# Patient Record
Sex: Male | Born: 1985 | Race: White | Hispanic: No | Marital: Married | Smoking: Current some day smoker
Health system: Southern US, Community
[De-identification: ages and names within clinical notes are randomized; demographics above are authoritative.]

## PROBLEM LIST (undated history)

## (undated) DIAGNOSIS — K219 Gastro-esophageal reflux disease without esophagitis: Secondary | ICD-10-CM

## (undated) DIAGNOSIS — I10 Essential (primary) hypertension: Secondary | ICD-10-CM

## (undated) DIAGNOSIS — I4891 Unspecified atrial fibrillation: Secondary | ICD-10-CM

## (undated) HISTORY — DX: Gastro-esophageal reflux disease without esophagitis: K21.9

---
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
## 2005-02-11 ENCOUNTER — Ambulatory Visit (HOSPITAL_COMMUNITY): Admission: RE | Admit: 2005-02-11 | Discharge: 2005-02-11 | Payer: Self-pay | Admitting: Family Medicine

## 2006-09-15 ENCOUNTER — Emergency Department (HOSPITAL_COMMUNITY): Admission: EM | Admit: 2006-09-15 | Discharge: 2006-09-15 | Payer: Self-pay | Admitting: Emergency Medicine

## 2009-01-02 ENCOUNTER — Emergency Department: Payer: Self-pay | Admitting: Internal Medicine

## 2010-07-09 NOTE — Consult Note (Signed)
Patrick Chase, Patrick Chase                 ACCOUNT NO.:  [id number]   MEDICAL RECORD NO.:  [id number]          PATIENT TYPE:  AMB   LOCATION:                                 FACILITY:   PHYSICIAN:  Lionel December, M.D.    DATE OF BIRTH:  1985-03-21   DATE OF CONSULTATION:  DATE OF DISCHARGE:                                   CONSULTATION   L   PRESENTING COMPLAINT:  Rectal bleeding.   HISTORY OF PRESENT ILLNESS:  Patrick Chase is an 25 year old Caucasian male who was  referred through the courtesy of Dr. Phillips Odor for evaluation of rectal  bleeding which started about 3 months ago. All of these episodes occurred  with his bowel movements. He noted fresh blood enough to color the water  red. He denies diarrhea and/or constipation. He has occasional fleeting  lower abdominal pain. He has a fair appetite. His appetite has dropped since  he has been on Adderall. He denies nausea, vomiting, heart burn or  dysphagia. He has not lost any weight recently. He does lift a lot of  weights. At times he has lifted as much as 290 pounds. He has never had any  problem with hemorrhoids.   CURRENT MEDICATIONS:  He is on Adderall 30 mg daily, Aciphex 20 mg q.a.m.  but he has taken perhaps 2 or 3 doses in the last 6 weeks. Aleve p.r.n. He  was also given a prescription for Anusol suppositories but has not used it  yet.   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 He has never had any surgeries.   ALLERGIES:  No known drug allergies.   FAMILY HISTORY:  Both parents and two half-brothers are in good health.   SOCIAL HISTORY:  He is single. He is a Holiday representative at [employer] and plans to go  to [employer] next year. He has been smoking 4-5 cigarettes per day for the last 3  years or so. He drinks beer usually on weekends, no more than 3-4 cans each  weekend.   PHYSICAL EXAMINATION:  GENERAL:  Pleasant, well-developed, thin Caucasian  male who is in no acute  distress. Weight is 154.5 pounds and he is 6 feet  and 1 inch tall.  VITAL SIGNS: Pulse 56 per minute. Blood pressure 112/80, temperature is  97.6.  HEENT:  Conjunctivae pink, sclerae anicteric. Oropharyngeal mucosa is  normal.  NECK:  No neck masses are noted.  CARDIAC EXAM:  Regular rhythm, normal S1, S2, no murmur or gallop noted.  LUNGS:  Clear to auscultation.  ABDOMEN:  Flat, bowel sounds are normal. Palpation reveals a soft abdomen  without tenderness, organomegaly or masses.  RECTAL:  Deferred.  EXTREMITIES:  No clubbing or peripheral edema.   ASSESSMENT:  Patrick Chase is an 25 year old Caucasian male who has had a few  episodes of hematochezia when he has passed small to moderate amounts of  blood. He has had occasional fleeting lower abdominal pain but he denies  diarrhea and/or constipation. His history if pertinent for weight lifting,  he is lifting as much as 290 pounds. I suspect he may be bleeding internally  from internal hemorrhoids. However, given clinical course, need to rule out  proctitis, colitis, or neoplasm.   RECOMMENDATIONS:  He will have a CBC, PT and PTT at the time of colonoscopy.   Total colonoscopy to be performed in the near future once he has graduated  from school.  I have reviewed the procedure and risks with the patient and his mother and  they both are agreeable.   I would like to thank Dr. Phillips Odor for the opportunity to participate in the  care of this nice young man.       NR/MEDQ  D:  07/05/2004  T:  07/06/2004  Job:  [id number]   cc:   Corrie Mckusick, M.D.  Fax: [phone]

## 2010-07-09 NOTE — Op Note (Signed)
NAMEARELI, Chase                 ACCOUNT NO.:  [id number]   MEDICAL RECORD NO.:  [id number]          PATIENT TYPE:  AMB   LOCATION:  DAY                           FACILITY:  [facility]   PHYSICIAN:  Lionel December, M.D.    DATE OF BIRTH:  12/31/1985   DATE OF PROCEDURE:  08/11/2004  DATE OF DISCHARGE:                                 OPERATIVE REPORT   PROCEDURE:  Colonoscopy which endoscopy.   INDICATIONS:  Patrick is an 25 year old Caucasian male with at least three-  month history of hematochezia without change in bowel habits or diarrhea. It  is suspected he may have hemorrhoids. Since bleeding is recurrent, he is  undergoing colonoscopy to make sure he does not have proctitis or neoplasm.  Procedure risks were reviewed with the patient, and informed consent was  obtained.   PREMEDICATION:  Demerol 50 mg IV, Versed 11 mg IV in divided dose.   FINDINGS:  Procedure performed in endoscopy suite. The patient's vital signs  and O2 saturation were monitored during the procedure and remained stable.  The patient was placed in left lateral position. Rectal examination  performed. No abnormality noted on external or digital exam. The Olympus  videoscope was placed in the rectum and advanced under vision into sigmoid  colon and beyond. Preparation was satisfactory. Scope was passed into the  cecum which was identified by appendiceal orifice and ileocecal valve.  Pictures taken for the record. The TI was examined for 10-15 cm and was  normal. As the scope was withdrawn, colonic mucosa was carefully examined.  There was a single tiny erosion at sigmoid colon felt to be nonspecific and  left alone. Mucosa of the rest of the sigmoid colon was normal. There was a  focal area of erythema and edema with some granularity at the rectum seen on  retroflexed view. It was biopsied for histology. There is single small  hemorrhoids above the dentate line. Endoscope was straightened and  withdrawn. The patient  tolerated the procedure well.   FINAL DIAGNOSIS:  1.  Normal terminal ileoscopy.  2.  A single small erosion at sigmoid colon felt to be nonspecific.  3.  Focal proctitis, biopsy obtained for histology.  4.  Small internal hemorrhoids.   Suspect his hematochezia could be secondary to proctitis or hemorrhoids.   RECOMMENDATIONS:  1.  Standard instructions given. I will contact the patient with results of      biopsies, CBC, PT/PTT.  2.  Canasa suppository 1 g per rectum q.h.s. for 30 days.       NR/MEDQ  D:  08/11/2004  T:  08/11/2004  Job:  [id number]   cc:   Corrie Mckusick, M.D.  Fax: [phone]

## 2013-04-04 ENCOUNTER — Ambulatory Visit: Payer: Self-pay | Admitting: Family Medicine

## 2014-05-01 ENCOUNTER — Emergency Department (HOSPITAL_COMMUNITY)
Admission: EM | Admit: 2014-05-01 | Discharge: 2014-05-01 | Disposition: A | Discharge status: Home / Self Care | Payer: BLUE CROSS/BLUE SHIELD | Attending: Emergency Medicine | Admitting: Emergency Medicine

## 2014-05-01 ENCOUNTER — Encounter (HOSPITAL_COMMUNITY): Payer: Self-pay | Admitting: Emergency Medicine

## 2014-05-01 ENCOUNTER — Emergency Department (HOSPITAL_COMMUNITY): Payer: BLUE CROSS/BLUE SHIELD

## 2014-05-01 DIAGNOSIS — I480 Paroxysmal atrial fibrillation: Secondary | ICD-10-CM | POA: Insufficient documentation

## 2014-05-01 DIAGNOSIS — R002 Palpitations: Secondary | ICD-10-CM | POA: Diagnosis present

## 2014-05-01 DIAGNOSIS — Z87891 Personal history of nicotine dependence: Secondary | ICD-10-CM | POA: Insufficient documentation

## 2014-05-01 DIAGNOSIS — I10 Essential (primary) hypertension: Secondary | ICD-10-CM | POA: Insufficient documentation

## 2014-05-01 HISTORY — DX: Essential (primary) hypertension: I10

## 2014-05-01 LAB — CBC WITH DIFFERENTIAL/PLATELET
BASOS PCT: 0 % (ref 0–1)
Basophils Absolute: 0 10*3/uL (ref 0.0–0.1)
Eosinophils Absolute: 0.1 10*3/uL (ref 0.0–0.7)
Eosinophils Relative: 2 % (ref 0–5)
HCT: 43.9 % (ref 39.0–52.0)
Hemoglobin: 15.6 g/dL (ref 13.0–17.0)
LYMPHS PCT: 26 % (ref 12–46)
Lymphs Abs: 1.9 10*3/uL (ref 0.7–4.0)
MCH: 32.8 pg (ref 26.0–34.0)
MCHC: 35.5 g/dL (ref 30.0–36.0)
MCV: 92.4 fL (ref 78.0–100.0)
MONOS PCT: 14 % — AB (ref 3–12)
Monocytes Absolute: 1.1 10*3/uL — ABNORMAL HIGH (ref 0.1–1.0)
NEUTROS PCT: 58 % (ref 43–77)
Neutro Abs: 4.3 10*3/uL (ref 1.7–7.7)
PLATELETS: 284 10*3/uL (ref 150–400)
RBC: 4.75 MIL/uL (ref 4.22–5.81)
RDW: 11.9 % (ref 11.5–15.5)
WBC: 7.4 10*3/uL (ref 4.0–10.5)

## 2014-05-01 LAB — COMPREHENSIVE METABOLIC PANEL
ALT: 28 U/L (ref 0–53)
AST: 17 U/L (ref 0–37)
Albumin: 4.4 g/dL (ref 3.5–5.2)
Alkaline Phosphatase: 67 U/L (ref 39–117)
Anion gap: 8 (ref 5–15)
BILIRUBIN TOTAL: 1 mg/dL (ref 0.3–1.2)
BUN: 16 mg/dL (ref 6–23)
CALCIUM: 9 mg/dL (ref 8.4–10.5)
CHLORIDE: 104 mmol/L (ref 96–112)
CO2: 27 mmol/L (ref 19–32)
Creatinine, Ser: 0.9 mg/dL (ref 0.50–1.35)
GLUCOSE: 93 mg/dL (ref 70–99)
POTASSIUM: 3.6 mmol/L (ref 3.5–5.1)
Sodium: 139 mmol/L (ref 135–145)
Total Protein: 7.2 g/dL (ref 6.0–8.3)

## 2014-05-01 LAB — TROPONIN I

## 2014-05-01 MED ORDER — DILTIAZEM HCL ER COATED BEADS 120 MG PO CP24: 120.0000 mg | ORAL_CAPSULE | Freq: Every day | ORAL | Status: DC

## 2014-05-01 MED ORDER — METOPROLOL TARTRATE 1 MG/ML IV SOLN
5.0000 mg | Freq: Once | INTRAVENOUS | Status: AC
  Administered 2014-05-01: 5 mg via INTRAVENOUS
  Filled 2014-05-01: qty 5

## 2014-05-01 MED ORDER — DILTIAZEM HCL 25 MG/5ML IV SOLN
10.0000 mg | Freq: Once | INTRAVENOUS | Status: AC
  Administered 2014-05-01: 10 mg via INTRAVENOUS
  Filled 2014-05-01: qty 5

## 2014-05-01 MED ORDER — SODIUM CHLORIDE 0.9 % IV BOLUS (SEPSIS)
1000.0000 mL | Freq: Once | INTRAVENOUS | Status: AC
  Administered 2014-05-01: 1000 mL via INTRAVENOUS

## 2014-05-01 MED ORDER — DILTIAZEM HCL ER COATED BEADS 120 MG PO CP24
120.0000 mg | ORAL_CAPSULE | Freq: Every day | ORAL | Status: DC
  Administered 2014-05-01: 120 mg via ORAL
  Filled 2014-05-01 (×2): qty 1

## 2014-05-01 NOTE — ED Notes (Signed)
Upon assessment of pt., HR went up to 190-220 range. Pt alert and oriented with no discomfort.  MD Zammit made aware.   Pt returned to baseline within 3 minutes of SVT episode.

## 2014-05-01 NOTE — ED Notes (Signed)
Patient Reports x2 episodes of dizziness (felt like room spinning) today. Per patient felt he was going to "pass out." Patient reports feeling his heart "pounding in chest." Patient reports hx of palpitations. Denies any dizziness at this time. Patient denies any other symptoms at this time. Patient's blood pressure 146/105 in which patient states he has hypertension and takes Losartan-patient states he has taken it before.

## 2014-05-01 NOTE — Discharge Instructions (Signed)
Follow up with [facility].   Decrease alcohol consumption

## 2014-05-01 NOTE — ED Provider Notes (Signed)
CSN: [id number]     Arrival date & time 05/01/14  1104 History  This chart was scribed for Bethann BerkshireJoseph Kendyl Festa, MD by Tonye RoyaltyJoshua Chen, ED Scribe. This patient was seen in room APA07/[facility]07 and the patient's care was started at 11:49 AM.    Chief Complaint  Patient presents with  . Palpitations   Patient is a 29 y.o. male presenting with palpitations. The history is provided by the patient. No language interpreter was used.  Palpitations Palpitations quality:  Irregular Onset quality:  Sudden Timing:  Intermittent Progression:  Resolved Chronicity:  Recurrent Context: not caffeine and not nicotine   Relieved by:  None tried Worsened by:  Nothing Ineffective treatments:  None tried Associated symptoms: dizziness   Associated symptoms: no back pain, no chest pain and no cough     HPI Comments: Patrick Chase is a 29 y.o. male with history of HTN who presents to the Emergency Department complaining of lightheadedness and palpitations. He states he had 1 episode early this morning and another at 1000. He states he has had similar symptoms before but has not been evaluated previously. He uses alcohol and states he sometimes feels palpitations the day after. He states he had 6-7 beers last night. He states he has an uncle who had a MI and his parents both had HTN. He is a former smoker, he quit June 2015. He denies any caffeine use.   Past Medical History  Diagnosis Date  . Hypertension    History reviewed. No pertinent past surgical history. Family History  Problem Relation Age of Onset  . Hypertension Other    History  Substance Use Topics  . Smoking status: Former Smoker -- 0.05 packs/day for 12 years    Types: Cigarettes    Quit date: 07/15/2013  . Smokeless tobacco: Never Used  . Alcohol Use: 9.6 oz/week    16 Cans of beer per week    Review of Systems  Constitutional: Negative for appetite change and fatigue.  HENT: Negative for congestion, ear discharge and sinus pressure.   Eyes:  Negative for discharge.  Respiratory: Negative for cough.   Cardiovascular: Positive for palpitations. Negative for chest pain.  Gastrointestinal: Negative for abdominal pain and diarrhea.  Genitourinary: Negative for frequency and hematuria.  Musculoskeletal: Negative for back pain.  Skin: Negative for rash.  Neurological: Positive for dizziness. Negative for seizures and headaches.  Psychiatric/Behavioral: Negative for hallucinations.      Allergies  Review of patient's allergies indicates no known allergies.  Home Medications   Prior to Admission medications   Not on File   BP 146/105 mmHg  Pulse 75  Temp(Src) 98.3 F (36.8 C) (Oral)  Resp 18  Ht 5\' 7"  (1.702 m)  Wt 195 lb (88.451 kg)  BMI 30.53 kg/m2  SpO2 100% Physical Exam  Constitutional: He is oriented to person, place, and time. He appears well-developed.  HENT:  Head: Normocephalic.  Eyes: Conjunctivae and EOM are normal. No scleral icterus.  Neck: Neck supple. No thyromegaly present.  Cardiovascular: Normal rate.  Exam reveals no gallop and no friction rub.   No murmur heard. Mild irregular heartbeat  Pulmonary/Chest: No stridor. He has no wheezes. He has no rales. He exhibits no tenderness.  Abdominal: He exhibits no distension. There is no tenderness. There is no rebound.  Musculoskeletal: Normal range of motion. He exhibits no edema.  Lymphadenopathy:    He has no cervical adenopathy.  Neurological: He is oriented to person, place, and time. He exhibits  normal muscle tone. Coordination normal.  Skin: No rash noted. No erythema.  Psychiatric: He has a normal mood and affect. His behavior is normal.  Nursing note and vitals reviewed.   ED Course  Procedures (including critical care time)  DIAGNOSTIC STUDIES: Oxygen Saturation is 100% on room air, normal by my interpretation.    COORDINATION OF CARE: 11:53 AM Discussed treatment plan with patient at beside, the patient agrees with the plan and has  no further questions at this time.   Labs Review Labs Reviewed - No data to display  Imaging Review No results found.   EKG Interpretation   Date/Time:  Thursday May 01 2014 14:19:11 EST Ventricular Rate:  171 PR Interval:  128 QRS Duration: 94 QT Interval:  312 QTC Calculation: 526 R Axis:   57 Text Interpretation:  Atrial fibrillation RSR' in V1 or V2, probably  normal variant Borderline repolarization abnormality Prolonged QT interval  Confirmed by Ashtin Melichar  MD, Mikea Quadros [phone]) on 05/01/2014 3:35:25 PM      MDM   Final diagnoses:  None    Atrial fib,  Spoke to cardiology and will start cardiazem and he will follow up with cards  The chart was scribed for me under my direct supervision.  I personally performed the history, physical, and medical decision making and all procedures in the evaluation of this patient.Bethann Berkshire, MD 05/01/14 [phone]

## 2014-05-01 NOTE — ED Notes (Signed)
Pt had run of VT captured on EKG, given to Dr. Estell HarpinZammit

## 2014-05-01 NOTE — ED Notes (Signed)
PT had run of Afib at rate of 170. Telemetry printed and gave to ER MD and gave verbal order for 10mg  Cardizem.

## 2014-05-01 NOTE — ED Notes (Signed)
Pt has run of VT at 160 BPM.  Rhythm strip printed and shown to Dr. Estell HarpinZammit with verbal order for Cardizem 10mg 

## 2014-05-07 ENCOUNTER — Encounter: Payer: Self-pay | Admitting: Physician Assistant

## 2014-05-07 ENCOUNTER — Ambulatory Visit (INDEPENDENT_AMBULATORY_CARE_PROVIDER_SITE_OTHER): Payer: BLUE CROSS/BLUE SHIELD | Admitting: Physician Assistant

## 2014-05-07 VITALS — BP 118/88 | HR 86 | Ht 73.0 in | Wt 191.6 lb

## 2014-05-07 DIAGNOSIS — I4891 Unspecified atrial fibrillation: Secondary | ICD-10-CM

## 2014-05-07 DIAGNOSIS — I10 Essential (primary) hypertension: Secondary | ICD-10-CM

## 2014-05-07 DIAGNOSIS — Z87891 Personal history of nicotine dependence: Secondary | ICD-10-CM

## 2014-05-07 DIAGNOSIS — Z136 Encounter for screening for cardiovascular disorders: Secondary | ICD-10-CM

## 2014-05-07 DIAGNOSIS — I480 Paroxysmal atrial fibrillation: Secondary | ICD-10-CM

## 2014-05-07 DIAGNOSIS — Z1329 Encounter for screening for other suspected endocrine disorder: Secondary | ICD-10-CM

## 2014-05-07 NOTE — Assessment & Plan Note (Signed)
10 year history of smoking. Quit in June 2015.

## 2014-05-07 NOTE — Assessment & Plan Note (Signed)
Atrial fibrillation converted to normal sinus rhythm with Cardizem.CHADSVASC=1. I discussed this patient in detail with Dr.Koneswaran who recommends continuing diltiazem. Check 2-D echo or structural heart disease. Check TSH. Decrease alcohol and caffeine intake. Continue exercise program. Follow-up with Dr. Purvis SheffieldKoneswaran in one month.

## 2014-05-07 NOTE — Assessment & Plan Note (Signed)
Controlled.  

## 2014-05-07 NOTE — Progress Notes (Signed)
Cardiology Office Note   Date:  05/07/2014   ID:  Patrick PascalSeth W Blakeman, DOB 07/14/1985, MRN [id number]  PCP:  No primary care provider on file.  Cardiologist:  New  Chief Complaint: Palpitations    History of Present Illness: Patrick Chase is a 29 y.o. male who presents for atrial fibrillation. On 05/01/14 patient developed palpitations and presented to the emergency room or he was found to be in atrial fibrillation with RVR. He had had 6-7 beers the night before. He says if he drinks usually he feels palpitations the next day. He converted to normal sinus rhythm with Cardizem. He is here for follow-up. He has a history of hypertension. He has an uncle who had an MI and parents both have hypertension. He is a former smoker, quit in June 2015. Labs in the ER normal.  Patient comes in today feeling well. He is exercising at the gym and lifting weights. He doesn't do much cardio. He works as a Chartered certified accountantmachinist. He has cut back his alcohol intake to 5 or 6 beers on the weekend(was every day). He drinks 32 ounces of coffee every day. He hasn't had any more palpitations. He has had palpitations in the past but none as severe as the day he came to the emergency room.   Past Medical History  Diagnosis Date  . Hypertension     No past surgical history on file.   Current Outpatient Prescriptions  Medication Sig Dispense Refill  . diltiazem (CARDIZEM CD) 120 MG 24 hr capsule Take 1 capsule (120 mg total) by mouth daily. 30 capsule 0  . losartan (COZAAR) 50 MG tablet Take 1 tablet by mouth daily.     No current facility-administered medications for this visit.    Allergies:   Review of patient's allergies indicates no known allergies.    Social History:  The patient  reports that he quit smoking about 9 months ago. His smoking use included Cigarettes. He has a .6 pack-year smoking history. He has never used smokeless tobacco. He reports that he drinks about 9.6 oz of alcohol per week. He reports that he  does not use illicit drugs.   Family History:  The patient's   family history includes Hypertension in his other.    ROS:  Please see the history of present illness.   Otherwise, review of systems are positive for none.   All other systems are reviewed and negative.    PHYSICAL EXAM: BP 118/88 mmHg  Pulse 86  Ht 6\' 1"  (1.854 m)  Wt 191 lb 9.6 oz (86.909 kg)  BMI 25.28 kg/m2  SpO2 99% GEN: Well nourished, well developed, in no acute distress HEENT: normal Neck: no JVD, HJR, carotid bruits, or masses Cardiac:RRR; no gallop ,murmurs, rubs, thrill or heave,no edema,   Respiratory:  clear to auscultation bilaterally, normal work of breathing GI: soft, nontender, nondistended, + BS MS: no deformity or atrophy Extremities: without cyanosis, clubbing, edema, good distal pulses bilaterally.  Skin: warm and dry, no rash Neuro:  Strength and sensation are intact Psych: euthymic mood, full affect   EKG:  EKG is ordered today. The ekg ordered today demonstrates normal sinus rhythm   Recent Labs: 05/01/2014: ALT 28; BUN 16; Creatinine 0.90; Hemoglobin 15.6; Platelets 284; Potassium 3.6; Sodium 139    Lipid Panel No results found for: CHOL, TRIG, HDL, CHOLHDL, VLDL, LDLCALC, LDLDIRECT    Wt Readings from Last 3 Encounters:  05/01/14 195 lb (88.451 kg)  ASSESSMENT AND PLAN: Atrial fibrillation Atrial fibrillation converted to normal sinus rhythm with Cardizem.CHADSVASC=1. I discussed this patient in detail with Dr.Koneswaran who recommends continuing diltiazem. Check 2-D echo or structural heart disease. Check TSH. Decrease alcohol and caffeine intake. Continue exercise program. Follow-up with Dr. Purvis Sheffield in one month.   Essential hypertension Controlled   Former smoker 10 year history of smoking. Quit in June 2015.      Elson Clan, PA-C  05/07/2014 1:06 PM    [facility] [address] [place], [place], [place]  [address] Phone:  [phone]; Fax: [phone]

## 2014-05-07 NOTE — Patient Instructions (Signed)
Your physician recommends that you schedule a follow-up appointment in: 1 [facility]   Your physician has requested that you have an echocardiogram. Echocardiography is a painless test that uses sound waves to create images of your heart. It provides your doctor with information about the size and shape of your heart and how well your heart's chambers and valves are working. This procedure takes approximately one hour. There are no restrictions for this procedure.  Your physician recommends that you return for lab work in: Today  Thank you for choosing [facility]!

## 2014-05-08 ENCOUNTER — Ambulatory Visit (HOSPITAL_COMMUNITY): Payer: BLUE CROSS/BLUE SHIELD

## 2014-05-08 LAB — TSH: TSH: 2.459 u[IU]/mL (ref 0.350–4.500)

## 2014-05-12 ENCOUNTER — Ambulatory Visit (HOSPITAL_COMMUNITY)
Admission: RE | Admit: 2014-05-12 | Discharge: 2014-05-12 | Disposition: A | Discharge status: Home / Self Care | Payer: BLUE CROSS/BLUE SHIELD | Source: Ambulatory Visit | Attending: Physician Assistant | Admitting: Physician Assistant

## 2014-05-12 DIAGNOSIS — I4891 Unspecified atrial fibrillation: Secondary | ICD-10-CM

## 2014-05-12 DIAGNOSIS — I480 Paroxysmal atrial fibrillation: Secondary | ICD-10-CM | POA: Diagnosis not present

## 2014-05-12 NOTE — Progress Notes (Signed)
  Echocardiogram 2D Echocardiogram has been performed.  Stacey DrainWhite, Mechille Varghese J 05/12/2014, 1:32 PM

## 2014-06-25 ENCOUNTER — Ambulatory Visit: Payer: BLUE CROSS/BLUE SHIELD | Admitting: Cardiovascular Disease

## 2014-07-08 ENCOUNTER — Telehealth: Payer: Self-pay | Admitting: Physician Assistant

## 2014-07-08 NOTE — Telephone Encounter (Signed)
FMLA completed and signed by Herma CarsonMichelle Lenze patient aware and asked for it to be mailed to his home address. Placed  In mail today.

## 2014-07-14 ENCOUNTER — Encounter: Payer: Self-pay | Admitting: Cardiovascular Disease

## 2014-07-14 ENCOUNTER — Ambulatory Visit (INDEPENDENT_AMBULATORY_CARE_PROVIDER_SITE_OTHER): Payer: BLUE CROSS/BLUE SHIELD | Admitting: Cardiovascular Disease

## 2014-07-14 VITALS — BP 124/90 | HR 70 | Ht 72.0 in | Wt 195.0 lb

## 2014-07-14 DIAGNOSIS — I10 Essential (primary) hypertension: Secondary | ICD-10-CM

## 2014-07-14 DIAGNOSIS — I480 Paroxysmal atrial fibrillation: Secondary | ICD-10-CM

## 2014-07-14 DIAGNOSIS — Z87891 Personal history of nicotine dependence: Secondary | ICD-10-CM | POA: Diagnosis not present

## 2014-07-14 MED ORDER — DILTIAZEM HCL 30 MG PO TABS: 30.0000 mg | ORAL_TABLET | Freq: Four times a day (QID) | ORAL | Status: DC

## 2014-07-14 MED ORDER — DILTIAZEM HCL 30 MG PO TABS: 30.0000 mg | ORAL_TABLET | Freq: Every day | ORAL | Status: DC | PRN

## 2014-07-14 NOTE — Patient Instructions (Signed)
Your physician wants you to follow-up in: 1 year with Dr Reggy EyeKoneswaran You will receive a reminder letter in the mail two months in advance. If you don't receive a letter, please call our office to schedule the follow-up appointment.    STOP Cardizem  START Diltiazem 30 mg as needed for palpitations      Thank you for choosing [facility] !

## 2014-07-14 NOTE — Addendum Note (Signed)
Addended by: Marlyn CorporalARLTON, Paxtyn Wisdom A on: 07/14/2014 02:11 PM   Modules accepted: Orders, Medications, Level of Service

## 2014-07-14 NOTE — Progress Notes (Signed)
Patient ID: Patrick PascalSeth W Chase, male   DOB: 1985-08-26, 29 y.o.   MRN: [id number]      SUBJECTIVE: The patient is a 29 year old male with a history of paroxysmal atrial fibrillation. This is my first time meeting him.   It was previously provoked by excessive alcohol intake. He is currently on long-acting diltiazem. He also has a history of hypertension. He is a former smoker. He has cut back his beer drinking to mostly on the weekends whereas he used to drink several beers daily. TSH was normal.   He had an episode of palpitations that lasted approximately 30 minutes about one week ago. He had over 12 beers to drink the night before and was dehydrated. He ran out of his long-acting diltiazem proximally 2 weeks ago. He is otherwise feeling well and denies exertional chest pain and shortness of breath.  Echocardiogram demonstrated normal left atrial size, normal left ventricular systolic and diastolic function, EF 55-60%, and mild LVH.   Review of Systems: As per "subjective", otherwise negative.  No Known Allergies  Current Outpatient Prescriptions  Medication Sig Dispense Refill  . losartan (COZAAR) 50 MG tablet Take 1 tablet by mouth daily.    [name]. diltiazem (CARDIZEM CD) 120 MG 24 hr capsule Take 1 capsule (120 mg total) by mouth daily. (Patient not taking: Reported on 07/14/2014) 30 capsule 0   No current facility-administered medications for this visit.    Past Medical History  Diagnosis Date  . Hypertension     No past surgical history on file.  History   Social History  . Marital Status: Single    Spouse Name: N/A  . Number of Children: N/A  . Years of Education: N/A   Occupational History  . Not on file.   Social History Main Topics  . Smoking status: Former Smoker -- 0.05 packs/day for 12 years    Types: Cigarettes    Start date: 02/21/1998    Quit date: 07/15/2013  . Smokeless tobacco: Never Used  . Alcohol Use: 9.6 oz/week    16 Cans of beer per week  . Drug Use:  No  . Sexual Activity: Not on file   Other Topics Concern  . Not on file   Social History Narrative     Filed Vitals:   07/14/14 1347  BP: 124/90  Pulse: 70  Height: 6' (1.829 m)  Weight: 195 lb (88.451 kg)  SpO2: 97%    PHYSICAL EXAM General: NAD HEENT: Normal. Neck: No JVD, no thyromegaly. Lungs: Clear to auscultation bilaterally with normal respiratory effort. CV: Nondisplaced PMI.  Regular rate and rhythm, normal S1/S2, no S3/S4, no murmur. No pretibial or periankle edema.  No carotid bruit.  Normal pedal pulses.  Abdomen: Soft, nontender, no hepatosplenomegaly, no distention.  Neurologic: Alert and oriented x 3.  Psych: Normal affect. Skin: Normal. Musculoskeletal: Normal range of motion, no gross deformities. Extremities: No clubbing or cyanosis.   ECG: Most recent ECG reviewed.      ASSESSMENT AND PLAN: 1. Paroxysmal atrial fibrillation: Will prescribe short-acting diltiazem 30 mg prn. Encouraged alcohol reduction given that this appears to be the inciting factor. CHADSVASC score of 1 (HTN). No indication for anticoagulation.  2. Essential HTN: Borderline DBP today. Will monitor.  Dispo: f/u 1 year.  Prentice DockerSuresh Koneswaran, M.D., F.A.C.C.

## 2014-07-14 NOTE — Addendum Note (Signed)
Addended by: Marlyn CorporalARLTON, Willella Harding A on: 07/14/2014 02:51 PM   Modules accepted: Orders, Medications

## 2014-10-24 ENCOUNTER — Ambulatory Visit (INDEPENDENT_AMBULATORY_CARE_PROVIDER_SITE_OTHER): Payer: BLUE CROSS/BLUE SHIELD | Admitting: Cardiovascular Disease

## 2014-10-24 ENCOUNTER — Encounter: Payer: Self-pay | Admitting: Cardiovascular Disease

## 2014-10-24 ENCOUNTER — Encounter: Payer: Self-pay | Admitting: *Deleted

## 2014-10-24 VITALS — BP 152/98 | HR 91 | Ht 72.0 in | Wt 195.0 lb

## 2014-10-24 DIAGNOSIS — F1010 Alcohol abuse, uncomplicated: Secondary | ICD-10-CM

## 2014-10-24 DIAGNOSIS — R002 Palpitations: Secondary | ICD-10-CM | POA: Diagnosis not present

## 2014-10-24 DIAGNOSIS — I10 Essential (primary) hypertension: Secondary | ICD-10-CM | POA: Diagnosis not present

## 2014-10-24 DIAGNOSIS — I480 Paroxysmal atrial fibrillation: Secondary | ICD-10-CM

## 2014-10-24 MED ORDER — LOSARTAN POTASSIUM 50 MG PO TABS: 75.0000 mg | ORAL_TABLET | Freq: Every day | ORAL | Status: DC

## 2014-10-24 NOTE — Patient Instructions (Addendum)
Increase Losartan to  daily - new sent to [facility] pharmacy today. Continue all other medications.   Your physician wants you to follow up in:  1 year.  You will receive a reminder letter in the mail one-two months in advance.  If you don't receive a letter, please call our office to schedule the follow up appointment

## 2014-10-24 NOTE — Progress Notes (Signed)
Patient ID: Patrick Chase, male   DOB: [birth year], 29 y.o.   MRN: [id number]      SUBJECTIVE: The patient presents for follow up of paroxysmal atrial fibrillation and hypertension. It was previously provoked by excessive alcohol intake. He is currently on long-acting diltiazem. He cut back his beer drinking to mostly on the weekends whereas he used to drink several beers daily. He does sometimes drink 2-3 beers after work on occasion.  Echocardiogram on 05/12/14 demonstrated normal left atrial size, normal left ventricular systolic and diastolic function, EF 55-60%, and mild LVH.  He has been experiencing more palpitations over the past week but it subsided by yesterday.   ECG performed in the office today demonstrates normal sinus rhythm with no arrhythmias.   His blood pressure is 152/98. He feels better when he physically exerts himself. He has more symptoms at rest.    Review of Systems: As per "subjective", otherwise negative.  No Known Allergies  Current Outpatient Prescriptions  Medication Sig Dispense Refill  . diltiazem (CARDIZEM) 30 MG tablet Take 1 tablet (30 mg total) by mouth daily as needed. 90 tablet 3  . losartan (COZAAR) 50 MG tablet Take 1 tablet by mouth daily.     No current facility-administered medications for this visit.    Past Medical History  Diagnosis Date  . Hypertension     No past surgical history on file.  Social History   Social History  . Marital Status: Single    Spouse Name: N/A  . Number of Children: N/A  . Years of Education: N/A   Occupational History  . Not on file.   Social History Main Topics  . Smoking status: Former Smoker -- 0.05 packs/day for 12 years    Types: Cigarettes    Start date: 02/21/1998    Quit date: 07/15/2013  . Smokeless tobacco: Never Used  . Alcohol Use: 9.6 oz/week    16 Cans of beer per week  . Drug Use: No  . Sexual Activity: Not on file   Other Topics Concern  . Not on file   Social History  Narrative     Filed Vitals:   10/24/14 1205  BP: 152/98  Pulse: 91  Height: 6' (1.829 m)  Weight: 195 lb (88.451 kg)  SpO2: 99%    PHYSICAL EXAM General: NAD HEENT: Normal. Neck: No JVD, no thyromegaly. Lungs: Clear to auscultation bilaterally with normal respiratory effort. CV: Nondisplaced PMI.  Regular rate and rhythm with frequent premature contractions, normal S1/S2, no S3/S4, no murmur. No pretibial or periankle edema.   Abdomen: Soft, no distention.  Neurologic: Alert and oriented x 3.  Psych: Normal affect. Skin: Normal. Musculoskeletal: Normal range of motion, no gross deformities. Extremities: No clubbing or cyanosis.   ECG: Most recent ECG reviewed.      ASSESSMENT AND PLAN: 1. Palpitations in context of paroxysmal atrial fibrillation: Continue short-acting diltiazem 30 mg prn. Again encouraged alcohol reduction given that this appears to be the inciting factor. CHADSVASC score of 1 (HTN). No indication for anticoagulation. He may be experiencing symptomatic premature contractions.  2. Essential HTN: Elevated. Increase losartan to 75 mg daily.  Dispo: f/u 1 year.   Prentice Docker, M.D., F.A.C.C.

## 2015-06-09 DIAGNOSIS — S134XXA Sprain of ligaments of cervical spine, initial encounter: Secondary | ICD-10-CM | POA: Diagnosis not present

## 2015-06-09 DIAGNOSIS — S233XXA Sprain of ligaments of thoracic spine, initial encounter: Secondary | ICD-10-CM | POA: Diagnosis not present

## 2015-06-09 DIAGNOSIS — S335XXA Sprain of ligaments of lumbar spine, initial encounter: Secondary | ICD-10-CM | POA: Diagnosis not present

## 2015-07-09 ENCOUNTER — Other Ambulatory Visit: Payer: Self-pay | Admitting: Cardiovascular Disease

## 2015-07-10 NOTE — Telephone Encounter (Signed)
Pt seen in [place] office,check dosage on medication prescribed,does not equal what MD ordered since 10/2014,[facility]

## 2015-08-03 DIAGNOSIS — S338XXA Sprain of other parts of lumbar spine and pelvis, initial encounter: Secondary | ICD-10-CM | POA: Diagnosis not present

## 2015-08-03 DIAGNOSIS — M546 Pain in thoracic spine: Secondary | ICD-10-CM | POA: Diagnosis not present

## 2015-08-03 DIAGNOSIS — S134XXA Sprain of ligaments of cervical spine, initial encounter: Secondary | ICD-10-CM | POA: Diagnosis not present

## 2015-09-11 ENCOUNTER — Ambulatory Visit: Payer: BLUE CROSS/BLUE SHIELD | Admitting: Cardiovascular Disease

## 2015-10-19 DIAGNOSIS — M546 Pain in thoracic spine: Secondary | ICD-10-CM | POA: Diagnosis not present

## 2015-10-19 DIAGNOSIS — S134XXA Sprain of ligaments of cervical spine, initial encounter: Secondary | ICD-10-CM | POA: Diagnosis not present

## 2015-10-19 DIAGNOSIS — S338XXA Sprain of other parts of lumbar spine and pelvis, initial encounter: Secondary | ICD-10-CM | POA: Diagnosis not present

## 2015-11-02 DIAGNOSIS — S134XXA Sprain of ligaments of cervical spine, initial encounter: Secondary | ICD-10-CM | POA: Diagnosis not present

## 2015-11-02 DIAGNOSIS — S338XXA Sprain of other parts of lumbar spine and pelvis, initial encounter: Secondary | ICD-10-CM | POA: Diagnosis not present

## 2015-11-02 DIAGNOSIS — M546 Pain in thoracic spine: Secondary | ICD-10-CM | POA: Diagnosis not present

## 2015-11-05 ENCOUNTER — Ambulatory Visit: Payer: BLUE CROSS/BLUE SHIELD | Admitting: Cardiovascular Disease

## 2015-11-19 ENCOUNTER — Other Ambulatory Visit: Payer: Self-pay | Admitting: Cardiovascular Disease

## 2015-12-01 DIAGNOSIS — M546 Pain in thoracic spine: Secondary | ICD-10-CM | POA: Diagnosis not present

## 2015-12-01 DIAGNOSIS — S338XXA Sprain of other parts of lumbar spine and pelvis, initial encounter: Secondary | ICD-10-CM | POA: Diagnosis not present

## 2015-12-01 DIAGNOSIS — S134XXA Sprain of ligaments of cervical spine, initial encounter: Secondary | ICD-10-CM | POA: Diagnosis not present

## 2016-01-01 DIAGNOSIS — Z6827 Body mass index (BMI) 27.0-27.9, adult: Secondary | ICD-10-CM | POA: Diagnosis not present

## 2016-01-01 DIAGNOSIS — Z833 Family history of diabetes mellitus: Secondary | ICD-10-CM | POA: Diagnosis not present

## 2016-01-01 DIAGNOSIS — R002 Palpitations: Secondary | ICD-10-CM | POA: Diagnosis not present

## 2016-01-01 DIAGNOSIS — I10 Essential (primary) hypertension: Secondary | ICD-10-CM | POA: Diagnosis not present

## 2016-01-04 ENCOUNTER — Ambulatory Visit: Payer: BLUE CROSS/BLUE SHIELD | Admitting: Cardiovascular Disease

## 2016-01-21 DIAGNOSIS — Z6827 Body mass index (BMI) 27.0-27.9, adult: Secondary | ICD-10-CM | POA: Diagnosis not present

## 2016-01-21 DIAGNOSIS — J0101 Acute recurrent maxillary sinusitis: Secondary | ICD-10-CM | POA: Diagnosis not present

## 2016-01-29 ENCOUNTER — Ambulatory Visit: Payer: BLUE CROSS/BLUE SHIELD | Admitting: Cardiovascular Disease

## 2016-02-08 ENCOUNTER — Other Ambulatory Visit: Payer: Self-pay

## 2016-02-08 DIAGNOSIS — S134XXA Sprain of ligaments of cervical spine, initial encounter: Secondary | ICD-10-CM | POA: Diagnosis not present

## 2016-02-08 DIAGNOSIS — S338XXA Sprain of other parts of lumbar spine and pelvis, initial encounter: Secondary | ICD-10-CM | POA: Diagnosis not present

## 2016-02-08 DIAGNOSIS — M546 Pain in thoracic spine: Secondary | ICD-10-CM | POA: Diagnosis not present

## 2016-02-08 MED ORDER — DILTIAZEM HCL 30 MG PO TABS: 30.0000 mg | ORAL_TABLET | Freq: Every day | ORAL | 3 refills | Status: DC | PRN

## 2016-02-08 NOTE — Telephone Encounter (Signed)
REFILL COMPLETE  

## 2016-02-26 ENCOUNTER — Encounter: Payer: Self-pay | Admitting: Cardiovascular Disease

## 2016-02-26 ENCOUNTER — Ambulatory Visit (INDEPENDENT_AMBULATORY_CARE_PROVIDER_SITE_OTHER): Payer: BLUE CROSS/BLUE SHIELD | Admitting: Cardiovascular Disease

## 2016-02-26 VITALS — BP 142/84 | HR 86 | Ht 72.0 in | Wt 194.0 lb

## 2016-02-26 DIAGNOSIS — I4891 Unspecified atrial fibrillation: Secondary | ICD-10-CM | POA: Diagnosis not present

## 2016-02-26 DIAGNOSIS — F1010 Alcohol abuse, uncomplicated: Secondary | ICD-10-CM

## 2016-02-26 DIAGNOSIS — I480 Paroxysmal atrial fibrillation: Secondary | ICD-10-CM | POA: Diagnosis not present

## 2016-02-26 DIAGNOSIS — R002 Palpitations: Secondary | ICD-10-CM

## 2016-02-26 DIAGNOSIS — I10 Essential (primary) hypertension: Secondary | ICD-10-CM

## 2016-02-26 NOTE — Patient Instructions (Signed)

## 2016-02-26 NOTE — Progress Notes (Signed)
      SUBJECTIVE: The patient presents for follow up of paroxysmal atrial fibrillation and hypertension. It was previously provoked by excessive alcohol intake. He is currently on diltiazem prn.  Echocardiogram on 05/12/14 demonstrated normal left atrial size, normal left ventricular systolic and diastolic function, EF 55-60%, and mild LVH.  He had some palpitations prior to Christmas when he was battling a sinus infection and was taking decongestants. Other than this time, he has not had any difficulties. He went to [place] and had no problems there. He had some right knee pain today and took some ibuprofen and blood pressure is 142/84.  ECG performed in the office today which I personally reviewed demonstrates normal sinus rhythm with no ischemic ST segment or T-wave abnormalities, nor any arrhythmias.   Review of Systems: As per "subjective", otherwise negative.  No Known Allergies  Current Outpatient Prescriptions  Medication Sig Dispense Refill  . diltiazem (CARDIZEM) 30 MG tablet Take 1 tablet (30 mg total) by mouth daily as needed. 90 tablet 3  . losartan (COZAAR) 50 MG tablet TAKE ONE AND ONE-HALF TABLETS BY MOUTH DAILY 45 tablet 3   No current facility-administered medications for this visit.     Past Medical History:  Diagnosis Date  . Hypertension     No past surgical history on file.  Social History   Social History  . Marital status: Single    Spouse name: N/A  . Number of children: N/A  . Years of education: N/A   Occupational History  . Not on file.   Social History Main Topics  . Smoking status: Former Smoker    Packs/day: 0.05    Years: 12.00    Types: Cigarettes    Start date: 02/21/1998    Quit date: 07/15/2013  . Smokeless tobacco: Never Used  . Alcohol use 9.6 oz/week    16 Cans of beer per week  . Drug use: No  . Sexual activity: Not on file   Other Topics Concern  . Not on file   Social History Narrative  . No narrative on file      Vitals:   02/26/16 1558  BP: (!) 142/84  Pulse: 86  Weight: 194 lb (88 kg)  Height: 6' (1.829 m)    PHYSICAL EXAM General: NAD HEENT: Normal. Neck: No JVD, no thyromegaly. Lungs: Clear to auscultation bilaterally with normal respiratory effort. CV: Nondisplaced PMI.  Regular rate and rhythm, normal S1/S2, no S3/S4, no murmur. No pretibial or periankle edema.  No carotid bruit.   Abdomen: Soft, nontender, no distention.  Neurologic: Alert and oriented.  Psych: Normal affect. Skin: Normal. Musculoskeletal: No gross deformities.    ECG: Most recent ECG reviewed.      ASSESSMENT AND PLAN: 1. Paroxysmal atrial fibrillation: Symptomatically stable. Continues to try to reduce alcohol intake. Continue short-acting diltiazem 30 mg prn. CHADSVASC score of 1 (HTN). No indication for anticoagulation.  2. Essential HTN: Mildly elevated. Has knee pain today. Needs further monitoring.  Dispo: f/u 1 year.   Prentice DockerSuresh Neasia Fleeman, M.D., F.A.C.C.

## 2016-03-23 DIAGNOSIS — S338XXA Sprain of other parts of lumbar spine and pelvis, initial encounter: Secondary | ICD-10-CM | POA: Diagnosis not present

## 2016-03-23 DIAGNOSIS — M546 Pain in thoracic spine: Secondary | ICD-10-CM | POA: Diagnosis not present

## 2016-03-23 DIAGNOSIS — S134XXA Sprain of ligaments of cervical spine, initial encounter: Secondary | ICD-10-CM | POA: Diagnosis not present

## 2016-04-18 DIAGNOSIS — S134XXA Sprain of ligaments of cervical spine, initial encounter: Secondary | ICD-10-CM | POA: Diagnosis not present

## 2016-04-18 DIAGNOSIS — S338XXA Sprain of other parts of lumbar spine and pelvis, initial encounter: Secondary | ICD-10-CM | POA: Diagnosis not present

## 2016-04-18 DIAGNOSIS — M546 Pain in thoracic spine: Secondary | ICD-10-CM | POA: Diagnosis not present

## 2016-05-03 ENCOUNTER — Other Ambulatory Visit: Payer: Self-pay | Admitting: Cardiovascular Disease

## 2016-05-23 DIAGNOSIS — M5412 Radiculopathy, cervical region: Secondary | ICD-10-CM | POA: Diagnosis not present

## 2016-05-23 DIAGNOSIS — Z6827 Body mass index (BMI) 27.0-27.9, adult: Secondary | ICD-10-CM | POA: Diagnosis not present

## 2016-05-23 DIAGNOSIS — M542 Cervicalgia: Secondary | ICD-10-CM | POA: Diagnosis not present

## 2016-05-25 DIAGNOSIS — S134XXA Sprain of ligaments of cervical spine, initial encounter: Secondary | ICD-10-CM | POA: Diagnosis not present

## 2016-05-25 DIAGNOSIS — S338XXA Sprain of other parts of lumbar spine and pelvis, initial encounter: Secondary | ICD-10-CM | POA: Diagnosis not present

## 2016-05-25 DIAGNOSIS — M546 Pain in thoracic spine: Secondary | ICD-10-CM | POA: Diagnosis not present

## 2016-05-30 DIAGNOSIS — M546 Pain in thoracic spine: Secondary | ICD-10-CM | POA: Diagnosis not present

## 2016-05-30 DIAGNOSIS — S134XXA Sprain of ligaments of cervical spine, initial encounter: Secondary | ICD-10-CM | POA: Diagnosis not present

## 2016-05-30 DIAGNOSIS — S338XXA Sprain of other parts of lumbar spine and pelvis, initial encounter: Secondary | ICD-10-CM | POA: Diagnosis not present

## 2016-06-02 DIAGNOSIS — S338XXA Sprain of other parts of lumbar spine and pelvis, initial encounter: Secondary | ICD-10-CM | POA: Diagnosis not present

## 2016-06-02 DIAGNOSIS — M546 Pain in thoracic spine: Secondary | ICD-10-CM | POA: Diagnosis not present

## 2016-06-02 DIAGNOSIS — S134XXA Sprain of ligaments of cervical spine, initial encounter: Secondary | ICD-10-CM | POA: Diagnosis not present

## 2016-06-09 DIAGNOSIS — S134XXA Sprain of ligaments of cervical spine, initial encounter: Secondary | ICD-10-CM | POA: Diagnosis not present

## 2016-06-09 DIAGNOSIS — S338XXA Sprain of other parts of lumbar spine and pelvis, initial encounter: Secondary | ICD-10-CM | POA: Diagnosis not present

## 2016-06-09 DIAGNOSIS — M546 Pain in thoracic spine: Secondary | ICD-10-CM | POA: Diagnosis not present

## 2016-06-14 DIAGNOSIS — M546 Pain in thoracic spine: Secondary | ICD-10-CM | POA: Diagnosis not present

## 2016-06-14 DIAGNOSIS — S134XXA Sprain of ligaments of cervical spine, initial encounter: Secondary | ICD-10-CM | POA: Diagnosis not present

## 2016-06-14 DIAGNOSIS — S338XXA Sprain of other parts of lumbar spine and pelvis, initial encounter: Secondary | ICD-10-CM | POA: Diagnosis not present

## 2016-10-10 ENCOUNTER — Other Ambulatory Visit: Payer: Self-pay | Admitting: Cardiovascular Disease

## 2016-10-31 DIAGNOSIS — M546 Pain in thoracic spine: Secondary | ICD-10-CM | POA: Diagnosis not present

## 2016-10-31 DIAGNOSIS — S134XXA Sprain of ligaments of cervical spine, initial encounter: Secondary | ICD-10-CM | POA: Diagnosis not present

## 2016-10-31 DIAGNOSIS — S338XXA Sprain of other parts of lumbar spine and pelvis, initial encounter: Secondary | ICD-10-CM | POA: Diagnosis not present

## 2017-01-05 DIAGNOSIS — Z0000 Encounter for general adult medical examination without abnormal findings: Secondary | ICD-10-CM | POA: Diagnosis not present

## 2017-01-05 DIAGNOSIS — Z6827 Body mass index (BMI) 27.0-27.9, adult: Secondary | ICD-10-CM | POA: Diagnosis not present

## 2017-01-16 DIAGNOSIS — S134XXA Sprain of ligaments of cervical spine, initial encounter: Secondary | ICD-10-CM | POA: Diagnosis not present

## 2017-01-16 DIAGNOSIS — M546 Pain in thoracic spine: Secondary | ICD-10-CM | POA: Diagnosis not present

## 2017-01-16 DIAGNOSIS — S338XXA Sprain of other parts of lumbar spine and pelvis, initial encounter: Secondary | ICD-10-CM | POA: Diagnosis not present

## 2017-01-17 DIAGNOSIS — I10 Essential (primary) hypertension: Secondary | ICD-10-CM | POA: Diagnosis not present

## 2017-01-17 DIAGNOSIS — Z6827 Body mass index (BMI) 27.0-27.9, adult: Secondary | ICD-10-CM | POA: Diagnosis not present

## 2017-01-17 DIAGNOSIS — I480 Paroxysmal atrial fibrillation: Secondary | ICD-10-CM | POA: Diagnosis not present

## 2017-01-17 DIAGNOSIS — J209 Acute bronchitis, unspecified: Secondary | ICD-10-CM | POA: Diagnosis not present

## 2017-02-20 DIAGNOSIS — R1030 Lower abdominal pain, unspecified: Secondary | ICD-10-CM | POA: Diagnosis not present

## 2017-02-20 DIAGNOSIS — R197 Diarrhea, unspecified: Secondary | ICD-10-CM | POA: Diagnosis not present

## 2017-02-20 DIAGNOSIS — Z6827 Body mass index (BMI) 27.0-27.9, adult: Secondary | ICD-10-CM | POA: Diagnosis not present

## 2017-02-27 ENCOUNTER — Emergency Department (HOSPITAL_COMMUNITY): Payer: BLUE CROSS/BLUE SHIELD

## 2017-02-27 ENCOUNTER — Emergency Department (HOSPITAL_COMMUNITY)
Admission: EM | Admit: 2017-02-27 | Discharge: 2017-02-27 | Disposition: A | Discharge status: Home / Self Care | Payer: BLUE CROSS/BLUE SHIELD | Attending: Emergency Medicine | Admitting: Emergency Medicine

## 2017-02-27 ENCOUNTER — Encounter (HOSPITAL_COMMUNITY): Payer: Self-pay

## 2017-02-27 DIAGNOSIS — I4891 Unspecified atrial fibrillation: Secondary | ICD-10-CM | POA: Diagnosis not present

## 2017-02-27 DIAGNOSIS — Z87891 Personal history of nicotine dependence: Secondary | ICD-10-CM | POA: Insufficient documentation

## 2017-02-27 DIAGNOSIS — Z79899 Other long term (current) drug therapy: Secondary | ICD-10-CM | POA: Diagnosis not present

## 2017-02-27 DIAGNOSIS — I10 Essential (primary) hypertension: Secondary | ICD-10-CM | POA: Insufficient documentation

## 2017-02-27 DIAGNOSIS — K529 Noninfective gastroenteritis and colitis, unspecified: Secondary | ICD-10-CM | POA: Diagnosis not present

## 2017-02-27 DIAGNOSIS — R1084 Generalized abdominal pain: Secondary | ICD-10-CM | POA: Diagnosis not present

## 2017-02-27 DIAGNOSIS — R197 Diarrhea, unspecified: Secondary | ICD-10-CM | POA: Diagnosis not present

## 2017-02-27 DIAGNOSIS — R109 Unspecified abdominal pain: Secondary | ICD-10-CM | POA: Diagnosis not present

## 2017-02-27 LAB — COMPREHENSIVE METABOLIC PANEL
ALT: 33 U/L (ref 17–63)
AST: 20 U/L (ref 15–41)
Albumin: 4.3 g/dL (ref 3.5–5.0)
Alkaline Phosphatase: 83 U/L (ref 38–126)
Anion gap: 11 (ref 5–15)
BUN: 9 mg/dL (ref 6–20)
CHLORIDE: 103 mmol/L (ref 101–111)
CO2: 24 mmol/L (ref 22–32)
CREATININE: 0.95 mg/dL (ref 0.61–1.24)
Calcium: 9.1 mg/dL (ref 8.9–10.3)
GFR calc Af Amer: >60 mL/min (ref >60)
GFR calc non Af Amer: >60 mL/min (ref >60)
Glucose, Bld: 157 mg/dL — ABNORMAL HIGH (ref 65–99)
Potassium: 3.8 mmol/L (ref 3.5–5.1)
SODIUM: 138 mmol/L (ref 135–145)
Total Bilirubin: 0.4 mg/dL (ref 0.3–1.2)
Total Protein: 7.4 g/dL (ref 6.5–8.1)

## 2017-02-27 LAB — CBC
HCT: 48.5 % (ref 39.0–52.0)
Hemoglobin: 16.7 g/dL (ref 13.0–17.0)
MCH: 32.2 pg (ref 26.0–34.0)
MCHC: 34.4 g/dL (ref 30.0–36.0)
MCV: 93.6 fL (ref 78.0–100.0)
PLATELETS: 313 10*3/uL (ref 150–400)
RBC: 5.18 MIL/uL (ref 4.22–5.81)
RDW: 12.4 % (ref 11.5–15.5)
WBC: 10.2 10*3/uL (ref 4.0–10.5)

## 2017-02-27 LAB — URINALYSIS, ROUTINE W REFLEX MICROSCOPIC
Bilirubin Urine: NEGATIVE
GLUCOSE, UA: NEGATIVE mg/dL
HGB URINE DIPSTICK: NEGATIVE
Ketones, ur: NEGATIVE mg/dL
Leukocytes, UA: NEGATIVE
Nitrite: NEGATIVE
PROTEIN: NEGATIVE mg/dL
Specific Gravity, Urine: 1.008 (ref 1.005–1.030)
pH: 5 (ref 5.0–8.0)

## 2017-02-27 LAB — LIPASE, BLOOD: LIPASE: 33 U/L (ref 11–51)

## 2017-02-27 MED ORDER — METRONIDAZOLE 500 MG PO TABS: 500.0000 mg | ORAL_TABLET | Freq: Three times a day (TID) | ORAL | 0 refills | Status: DC

## 2017-02-27 MED ORDER — IOPAMIDOL (ISOVUE-300) INJECTION 61%
100.0000 mL | Freq: Once | INTRAVENOUS | Status: AC | PRN
  Administered 2017-02-27: 100 mL via INTRAVENOUS

## 2017-02-27 MED ORDER — METRONIDAZOLE 500 MG PO TABS
500.0000 mg | ORAL_TABLET | Freq: Once | ORAL | Status: AC
  Administered 2017-02-27: 500 mg via ORAL
  Filled 2017-02-27: qty 1

## 2017-02-27 NOTE — Discharge Instructions (Signed)
Start back taking your Cipro and follow-up with the GI doctor as planned drink plenty of fluids and take Imodium if needed for diarrhea

## 2017-02-27 NOTE — ED Triage Notes (Addendum)
Pt reports lower abdominal pain for 3 weeks with diarrhea. Pt reports he was started cipro on Friday for staph , but was told to stop taking it. Was told to come for work up. Pt being seen by [facility]. Pt reports he has at least 10 stools per day

## 2017-02-27 NOTE — ED Provider Notes (Signed)
[facility] EMERGENCY DEPARTMENT Provider Note   CSN: [id number] Arrival date & time: 02/27/17  1646     History   Chief Complaint Chief Complaint  Patient presents with  . Abdominal Pain    HPI Patrick Chase is a 32 y.o. male.  Patient complains of diarrhea for a number weeks.  Patient has been on Cipro for a few days with no help.   The history is provided by the patient. No language interpreter was used.  Abdominal Pain   This is a new problem. The current episode started more than 1 week ago. The problem occurs constantly. The problem has not changed since onset.The pain is associated with an unknown factor. The pain is located in the generalized abdominal region. The quality of the pain is aching. Associated symptoms include diarrhea. Pertinent negatives include frequency, hematuria and headaches.    Past Medical History:  Diagnosis Date  . Hypertension     Patient Active Problem List   Diagnosis Date Noted  . Atrial fibrillation (HCC) 05/07/2014  . Essential hypertension 05/07/2014  . Former smoker 05/07/2014    No past surgical history on file.     Home Medications    Prior to Admission medications   Medication Sig Start Date End Date Taking? Authorizing Provider  ciprofloxacin (CIPRO) 500 MG tablet Take 500 mg by mouth 2 (two) times daily. 02/24/17  Yes [provider]  diltiazem (CARDIZEM) 30 MG tablet Take 1 tablet (30 mg total) by mouth daily as needed. 02/08/16  Yes Laqueta LindenKoneswaran, Suresh A, MD  losartan (COZAAR) 50 MG tablet TAKE ONE AND A HALF TABLETS BY MOUTH DAILY. 10/11/16  Yes Laqueta LindenKoneswaran, Suresh A, MD  metroNIDAZOLE (FLAGYL) 500 MG tablet Take 1 tablet (500 mg total) by mouth 3 (three) times daily. 02/27/17   Bethann BerkshireZammit, Draylon Mercadel, MD    Family History Family History  Problem Relation Age of Onset  . Hypertension Other     Social History Social History   Tobacco Use  . Smoking status: Former Smoker    Packs/day: 0.05    Years: 12.00    Pack  years: 0.60    Types: Cigarettes    Start date: 02/21/1998    Last attempt to quit: 07/15/2013    Years since quitting: 3.6  . Smokeless tobacco: Never Used  Substance Use Topics  . Alcohol use: Yes    Alcohol/week: 3.6 - 4.8 oz    Types: 6 - 8 Cans of beer per week  . Drug use: No     Allergies   Patient has no known allergies.   Review of Systems Review of Systems  Constitutional: Negative for appetite change and fatigue.  HENT: Negative for congestion, ear discharge and sinus pressure.   Eyes: Negative for discharge.  Respiratory: Negative for cough.   Cardiovascular: Negative for chest pain.  Gastrointestinal: Positive for abdominal pain and diarrhea.  Genitourinary: Negative for frequency and hematuria.  Musculoskeletal: Negative for back pain.  Skin: Negative for rash.  Neurological: Negative for seizures and headaches.  Psychiatric/Behavioral: Negative for hallucinations.     Physical Exam Updated Vital Signs BP (!) 145/98 (BP Location: Left Arm)   Pulse 88   Temp 98.3 F (36.8 C) (Oral)   Resp 20   Ht 6' (1.829 m)   Wt 85.3 kg (188 lb)   SpO2 99%   BMI 25.50 kg/m   Physical Exam  Constitutional: He is oriented to person, place, and time. He appears well-developed.  HENT:  Head:  Normocephalic.  Eyes: Conjunctivae and EOM are normal. No scleral icterus.  Neck: Neck supple. No thyromegaly present.  Cardiovascular: Normal rate and regular rhythm. Exam reveals no gallop and no friction rub.  No murmur heard. Pulmonary/Chest: No stridor. He has no wheezes. He has no rales. He exhibits no tenderness.  Abdominal: He exhibits no distension. There is no tenderness. There is no rebound.  Musculoskeletal: Normal range of motion. He exhibits no edema.  Lymphadenopathy:    He has no cervical adenopathy.  Neurological: He is oriented to person, place, and time. He exhibits normal muscle tone. Coordination normal.  Skin: No rash noted. No erythema.  Psychiatric: He  has a normal mood and affect. His behavior is normal.     ED Treatments / Results  Labs (all labs ordered are listed, but only abnormal results are displayed) Labs Reviewed  COMPREHENSIVE METABOLIC PANEL - Abnormal; Notable for the following components:      Result Value   Glucose, Bld 157 (*)    All other components within normal limits  LIPASE, BLOOD  CBC  URINALYSIS, ROUTINE W REFLEX MICROSCOPIC    EKG  EKG Interpretation None       Radiology Ct Abdomen Pelvis W Contrast  Result Date: 02/27/2017 CLINICAL DATA:  Lower abdominal pain for 3 weeks with diarrhea. Patient was started on Cipro on Friday for staph. Abdominal distention. EXAM: CT ABDOMEN AND PELVIS WITH CONTRAST TECHNIQUE: Multidetector CT imaging of the abdomen and pelvis was performed using the standard protocol following bolus administration of intravenous contrast. CONTRAST:  ISOVUE-300 IOPAMIDOL (ISOVUE-300) INJECTION 61% COMPARISON:  None. FINDINGS: Lower chest: No acute abnormality. Hepatobiliary: No focal liver abnormality is seen. No gallstones, gallbladder wall thickening, or biliary dilatation. Pancreas: Unremarkable. No pancreatic ductal dilatation or surrounding inflammatory changes. Spleen: Normal in size without focal abnormality. Adrenals/Urinary Tract: Adrenal glands are unremarkable. Kidneys are normal, without renal calculi, focal lesion, or hydronephrosis. Bladder is unremarkable. Stomach/Bowel: Moderate transmural thickening of the colon from cecum through distal transverse colon compatible with infectious or inflammatory colitis. No bowel obstruction. Normal appendix. Normal small bowel rotation. Unremarkable stomach. Vascular/Lymphatic: No significant vascular findings are present. No enlarged abdominal or pelvic lymph nodes. Reproductive: Prostate is unremarkable. Other: No abdominal wall hernia or abnormality. No abdominopelvic ascites. Musculoskeletal: No acute or significant osseous findings.  IMPRESSION: Cecal through distal transverse colonic transmural thickening compatible with acute infectious or inflammatory colitis. Electronically Signed   By: Tollie Eth M.D.   On: 02/27/2017 21:56    Procedures Procedures (including critical care time)  Medications Ordered in ED Medications  metroNIDAZOLE (FLAGYL) tablet 500 mg (not administered)  iopamidol (ISOVUE-300) 61 % injection 100 mL (100 mLs Intravenous Contrast Given 02/27/17 2134)     Initial Impression / Assessment and Plan / ED Course  I have reviewed the triage vital signs and the nursing notes.  Pertinent labs & imaging results that were available during my care of the patient were reviewed by me and considered in my medical decision making (see chart for details).     CT scan shows colitis.  Patient will be placed on Flagyl and Cipro and follow-up with GI as planned next week  Final Clinical Impressions(s) / ED Diagnoses   Final diagnoses:  Colitis    ED Discharge Orders        Ordered    metroNIDAZOLE (FLAGYL) 500 MG tablet  3 times daily     02/27/17 2221       Bethann Berkshire, MD  02/27/17 2224  

## 2017-02-27 NOTE — ED Notes (Signed)
Pt was told to stop Cipro and was called in Doxycycline, but has not started that.

## 2017-03-01 ENCOUNTER — Encounter (INDEPENDENT_AMBULATORY_CARE_PROVIDER_SITE_OTHER): Payer: Self-pay | Admitting: Internal Medicine

## 2017-03-02 ENCOUNTER — Ambulatory Visit (INDEPENDENT_AMBULATORY_CARE_PROVIDER_SITE_OTHER): Payer: BLUE CROSS/BLUE SHIELD | Admitting: Internal Medicine

## 2017-03-02 ENCOUNTER — Encounter (INDEPENDENT_AMBULATORY_CARE_PROVIDER_SITE_OTHER): Payer: Self-pay | Admitting: Internal Medicine

## 2017-03-02 VITALS — BP 140/90 | HR 94 | Resp 18 | Ht 72.0 in | Wt 185.4 lb

## 2017-03-02 DIAGNOSIS — K5289 Other specified noninfective gastroenteritis and colitis: Secondary | ICD-10-CM

## 2017-03-02 LAB — COMPREHENSIVE METABOLIC PANEL
AG Ratio: 2 (calc) (ref 1.0–2.5)
ALKALINE PHOSPHATASE (APISO): 69 U/L (ref 40–115)
ALT: 23 U/L (ref 9–46)
AST: 15 U/L (ref 10–40)
Albumin: 4.3 g/dL (ref 3.6–5.1)
BUN: 8 mg/dL (ref 7–25)
CO2: 28 mmol/L (ref 20–32)
CREATININE: 1.04 mg/dL (ref 0.60–1.35)
Calcium: 9.2 mg/dL (ref 8.6–10.3)
Chloride: 103 mmol/L (ref 98–110)
GLUCOSE: 87 mg/dL (ref 65–139)
Globulin: 2.1 g/dL (calc) (ref 1.9–3.7)
Potassium: 4.7 mmol/L (ref 3.5–5.3)
Sodium: 138 mmol/L (ref 135–146)
Total Bilirubin: 0.5 mg/dL (ref 0.2–1.2)
Total Protein: 6.4 g/dL (ref 6.1–8.1)

## 2017-03-02 LAB — CBC WITH DIFFERENTIAL/PLATELET
BASOS ABS: 68 {cells}/uL (ref 0–200)
Basophils Relative: 0.6 %
EOS ABS: 136 {cells}/uL (ref 15–500)
Eosinophils Relative: 1.2 %
HCT: 43 % (ref 38.5–50.0)
Hemoglobin: 15.5 g/dL (ref 13.2–17.1)
Lymphs Abs: 2576 cells/uL (ref 850–3900)
MCH: 32.8 pg (ref 27.0–33.0)
MCHC: 36 g/dL (ref 32.0–36.0)
MCV: 90.9 fL (ref 80.0–100.0)
MONOS PCT: 9 %
MPV: 9.5 fL (ref 7.5–12.5)
NEUTROS PCT: 66.4 %
Neutro Abs: 7503 cells/uL (ref 1500–7800)
PLATELETS: 358 10*3/uL (ref 140–400)
RBC: 4.73 10*6/uL (ref 4.20–5.80)
RDW: 12.2 % (ref 11.0–15.0)
TOTAL LYMPHOCYTE: 22.8 %
WBC mixed population: 1017 cells/uL — ABNORMAL HIGH (ref 200–950)
WBC: 11.3 10*3/uL — ABNORMAL HIGH (ref 3.8–10.8)

## 2017-03-02 NOTE — Progress Notes (Addendum)
   Subjective:    Patient ID: Patrick PascalSeth Patrick Chase, male    DOB: December 01, 1985, 32 y.o.   MRN: [id number]  HPI Seen in the ED 02/27/2017 with diarrhea for several weeks and abdominal pain.  Had been on Cipro and Flagyl. Has about 7 more days on the antibiotics.   Patrick Chase. The pain was in his lower abdomen. No fever that he can remember. He was having 6-10 stools a day. States the episode lasted about 3 weeks. He states he is better. He does not have abdominal pain. He did have rectal bleeding yesterday.  Stools are getting more formed.  CT on 02/27/2017 showed IMPRESSION: Cecal through distal transverse colonic transmural thickening compatible with acute infectious or inflammatory colitis. ? Tested positive for staph. C-diff was negative.  He is 50% or better.  He is having 2 stools.  States he had a colonoscopy at age 32 for rectal bleeding and was normal by Patrick Chase. In 2006 her underwent a colonoscopy for hematochezia witch revealed:  1.  Normal terminal ileoscopy. Biopsy: mild chronic,nonspecific proctitis.   2.  A single small erosion at sigmoid colon felt to be nonspecific.  3.  Focal proctitis, biopsy obtained for histology.  4.  Small internal hemorrhoids. States he had been on a Z pak about 2 months ago. A couple of weeks later he received an antibiotic shot. His appetite is better. He is avoiding spicy foods.  Weight loss of about 5 pounds since this started  02/20/2017 Stool culture:Moderate growth of coagulase positive staph. No salmonella, shigella, Yersinia, Vibrio isolated at 48 hrs. Staph Aureus Aureus. E coli not detected. C diff negative.     CBC    Component Value Date/Time   WBC 10.2 02/27/2017 1717   RBC 5.18 02/27/2017 1717   HGB 16.7 02/27/2017 1717   HCT 48.5 02/27/2017 1717   PLT 313 02/27/2017 1717   MCV 93.6 02/27/2017 1717   MCH 32.2 02/27/2017 1717   MCHC 34.4 02/27/2017 1717   RDW 12.4 02/27/2017 1717   LYMPHSABS 1.9 05/01/2014 1215   MONOABS 1.1 (H)  05/01/2014 1215   EOSABS 0.1 05/01/2014 1215   BASOSABS 0.0 05/01/2014 1215      Review of Systems     Objective:   Physical Exam Blood pressure 140/90, pulse 94, resp. rate 18, height 6' (1.829 m), weight 185 lb 6.4 oz (84.1 kg). Alert and oriented. Skin warm and dry. Oral mucosa is moist.   . Sclera anicteric, conjunctivae is pink. Thyroid not enlarged. No cervical lymphadenopathy. Lungs clear. Heart regular rate and rhythm.  Abdomen is soft. Bowel sounds are positive. No hepatomegaly. No abdominal masses felt. No tenderness.  No edema to lower extremities.           Assessment & Plan:  Cbc,cmet, GI pathogen . Continue the Cipro and Flagyl. Further recommendations to follow.

## 2017-03-02 NOTE — Patient Instructions (Signed)
Will get labs and GI pathogen.

## 2017-03-06 ENCOUNTER — Telehealth (INDEPENDENT_AMBULATORY_CARE_PROVIDER_SITE_OTHER): Payer: Self-pay | Admitting: Internal Medicine

## 2017-03-06 DIAGNOSIS — R197 Diarrhea, unspecified: Secondary | ICD-10-CM

## 2017-03-06 LAB — GASTROINTESTINAL PATHOGEN PANEL PCR
C. DIFFICILE TOX A/B, PCR: NOT DETECTED
Campylobacter, PCR: NOT DETECTED
Cryptosporidium, PCR: NOT DETECTED
E COLI (ETEC) LT/ST, PCR: NOT DETECTED
E COLI (STEC) STX1/STX2, PCR: NOT DETECTED
E COLI 0157, PCR: NOT DETECTED
Giardia lamblia, PCR: NOT DETECTED
Norovirus, PCR: NOT DETECTED
Rotavirus A, PCR: NOT DETECTED
SALMONELLA, PCR: NOT DETECTED
Shigella, PCR: NOT DETECTED

## 2017-03-06 NOTE — Telephone Encounter (Signed)
CBC and CRP ordered.

## 2017-03-07 DIAGNOSIS — R197 Diarrhea, unspecified: Secondary | ICD-10-CM | POA: Diagnosis not present

## 2017-03-08 LAB — CBC WITH DIFFERENTIAL/PLATELET
BASOS PCT: 0.9 %
Basophils Absolute: 88 cells/uL (ref 0–200)
EOS ABS: 206 {cells}/uL (ref 15–500)
EOS PCT: 2.1 %
HCT: 42.1 % (ref 38.5–50.0)
HEMOGLOBIN: 15.1 g/dL (ref 13.2–17.1)
Lymphs Abs: 2685 cells/uL (ref 850–3900)
MCH: 32.5 pg (ref 27.0–33.0)
MCHC: 35.9 g/dL (ref 32.0–36.0)
MCV: 90.5 fL (ref 80.0–100.0)
MONOS PCT: 12.3 %
MPV: 9.8 fL (ref 7.5–12.5)
NEUTROS ABS: 5615 {cells}/uL (ref 1500–7800)
Neutrophils Relative %: 57.3 %
PLATELETS: 356 10*3/uL (ref 140–400)
RBC: 4.65 10*6/uL (ref 4.20–5.80)
RDW: 12.4 % (ref 11.0–15.0)
TOTAL LYMPHOCYTE: 27.4 %
WBC mixed population: 1205 cells/uL — ABNORMAL HIGH (ref 200–950)
WBC: 9.8 10*3/uL (ref 3.8–10.8)

## 2017-03-08 LAB — C-REACTIVE PROTEIN: CRP: 1.7 mg/L (ref <8.0)

## 2017-03-10 ENCOUNTER — Encounter (INDEPENDENT_AMBULATORY_CARE_PROVIDER_SITE_OTHER): Payer: Self-pay | Admitting: Internal Medicine

## 2017-03-10 NOTE — Progress Notes (Signed)
Patient was given an appointment for 05/04/17 at 11:30am with Dorene Arerri Setzer, NP.  A letter was mailed to the patient.

## 2017-03-13 DIAGNOSIS — S134XXA Sprain of ligaments of cervical spine, initial encounter: Secondary | ICD-10-CM | POA: Diagnosis not present

## 2017-03-13 DIAGNOSIS — S338XXA Sprain of other parts of lumbar spine and pelvis, initial encounter: Secondary | ICD-10-CM | POA: Diagnosis not present

## 2017-03-13 DIAGNOSIS — M546 Pain in thoracic spine: Secondary | ICD-10-CM | POA: Diagnosis not present

## 2017-04-17 ENCOUNTER — Other Ambulatory Visit: Payer: Self-pay | Admitting: Cardiovascular Disease

## 2017-05-04 ENCOUNTER — Ambulatory Visit (INDEPENDENT_AMBULATORY_CARE_PROVIDER_SITE_OTHER): Payer: BLUE CROSS/BLUE SHIELD | Admitting: Internal Medicine

## 2017-05-04 ENCOUNTER — Encounter (INDEPENDENT_AMBULATORY_CARE_PROVIDER_SITE_OTHER): Payer: Self-pay | Admitting: Internal Medicine

## 2017-05-30 ENCOUNTER — Other Ambulatory Visit: Payer: Self-pay | Admitting: Cardiovascular Disease

## 2017-06-06 ENCOUNTER — Other Ambulatory Visit: Payer: Self-pay | Admitting: Cardiovascular Disease

## 2017-06-06 MED ORDER — LOSARTAN POTASSIUM 50 MG PO TABS: 75.0000 mg | ORAL_TABLET | Freq: Every day | ORAL | 0 refills | Status: DC

## 2017-06-06 NOTE — Telephone Encounter (Signed)
RX sent

## 2017-06-06 NOTE — Telephone Encounter (Signed)
Patient needs refill on losartan (COZAAR) 50 MG tablet   He has appointment on 07/04/17 with Dr. Purvis [facility]Koneswaran. [facility]

## 2017-07-04 ENCOUNTER — Other Ambulatory Visit: Payer: Self-pay

## 2017-07-04 ENCOUNTER — Encounter: Payer: Self-pay | Admitting: Cardiovascular Disease

## 2017-07-04 ENCOUNTER — Ambulatory Visit: Payer: BLUE CROSS/BLUE SHIELD | Admitting: Cardiovascular Disease

## 2017-07-04 VITALS — BP 136/77 | HR 71 | Ht 69.0 in | Wt 198.0 lb

## 2017-07-04 DIAGNOSIS — I480 Paroxysmal atrial fibrillation: Secondary | ICD-10-CM | POA: Diagnosis not present

## 2017-07-04 DIAGNOSIS — I10 Essential (primary) hypertension: Secondary | ICD-10-CM

## 2017-07-04 MED ORDER — DILTIAZEM HCL 30 MG PO TABS: ORAL_TABLET | ORAL | 2 refills | Status: DC

## 2017-07-04 MED ORDER — LOSARTAN POTASSIUM 50 MG PO TABS: 75.0000 mg | ORAL_TABLET | Freq: Every day | ORAL | 3 refills | Status: DC

## 2017-07-04 NOTE — Addendum Note (Signed)
Addended by: Lesle Chris on: 07/04/2017 04:23 PM   Modules accepted: Orders

## 2017-07-04 NOTE — Patient Instructions (Signed)

## 2017-07-04 NOTE — Progress Notes (Signed)
SUBJECTIVE: The patient presents for follow up of paroxysmal atrial fibrillation and hypertension. It was previously provoked by excessive alcohol intake. He is currently on diltiazem prn.  Echocardiogram on 05/12/14 demonstrated normal left atrial size, normal left ventricular systolic and diastolic function, EF 55-60%, and mild LVH.  ECG performed in the office today which I ordered and personally interpreted demonstrates normal sinus rhythm with no ischemic ST segment or T-wave abnormalities, nor any arrhythmias.  He denies shortness of breath and chest pain.  He has occasional palpitations and uses diltiazem when needed when symptoms last longer than 30 minutes.  He denies dizziness and syncope.  He usually drinks anywhere between 2 and 6 beers on a daily basis but does not drink when he exercises.  He is trying to get back into exercising on a more routine basis and wants to cut back alcohol consumption.       Review of Systems: As per "subjective", otherwise negative.  No Known Allergies  Current Outpatient Medications  Medication Sig Dispense Refill  . diltiazem (CARDIZEM) 30 MG tablet TAKE ONE TABLET BY MOUTH ONCE A DAY AS NEEDED 15 tablet 0  . losartan (COZAAR) 50 MG tablet Take 1.5 tablets (75 mg total) by mouth daily. 45 tablet 0   No current facility-administered medications for this visit.     Past Medical History:  Diagnosis Date  . Hypertension     No past surgical history on file.  Social History   Socioeconomic History  . Marital status: Single    Spouse name: Not on file  . Number of children: Not on file  . Years of education: Not on file  . Highest education level: Not on file  Occupational History  . Not on file  Social Needs  . Financial resource strain: Not on file  . Food insecurity:    Worry: Not on file    Inability: Not on file  . Transportation needs:    Medical: Not on file    Non-medical: Not on file  Tobacco Use  . Smoking  status: Former Smoker    Packs/day: 0.05    Years: 12.00    Pack years: 0.60    Types: Cigarettes    Start date: 02/21/1998    Last attempt to quit: 07/15/2013    Years since quitting: 3.9  . Smokeless tobacco: Never Used  Substance and Sexual Activity  . Alcohol use: Yes    Alcohol/week: 3.6 - 4.8 oz    Types: 6 - 8 Cans of beer per week  . Drug use: No  . Sexual activity: Not on file  Lifestyle  . Physical activity:    Days per week: Not on file    Minutes per session: Not on file  . Stress: Not on file  Relationships  . Social connections:    Talks on phone: Not on file    Gets together: Not on file    Attends religious service: Not on file    Active member of club or organization: Not on file    Attends meetings of clubs or organizations: Not on file    Relationship status: Not on file  . Intimate partner violence:    Fear of current or ex partner: Not on file    Emotionally abused: Not on file    Physically abused: Not on file    Forced sexual activity: Not on file  Other Topics Concern  . Not on file  Social History Narrative  .  Not on file     Vitals:   07/04/17 1602  BP: 136/77  Pulse: 71  SpO2: 100%  Weight: 198 lb (89.8 kg)  Height:  (1.753 m)    Wt Readings from Last 3 Encounters:  07/04/17 198 lb (89.8 kg)  03/02/17 185 lb 6.4 oz (84.1 kg)  02/27/17 188 lb (85.3 kg)     PHYSICAL EXAM General: NAD HEENT: Normal. Neck: No JVD, no thyromegaly. Lungs: Clear to auscultation bilaterally with normal respiratory effort. CV: Regular rate and rhythm with occasional premature contractions, normal S1/S2, no S3/S4, no murmur. No pretibial or periankle edema.  No carotid bruit.   Abdomen: Soft, nontender, no distention.  Neurologic: Alert and oriented.  Psych: Normal affect. Skin: Normal. Musculoskeletal: No gross deformities.    ECG: Most recent ECG reviewed.   Labs: Lab Results  Component Value Date/Time   K 4.7 03/02/2017 02:59 PM   BUN  8 03/02/2017 02:59 PM   CREATININE 1.04 03/02/2017 02:59 PM   ALT 23 03/02/2017 02:59 PM   TSH 2.459 05/07/2014 02:01 PM   HGB 15.1 03/07/2017 04:27 PM     Lipids: No results found for: LDLCALC, LDLDIRECT, CHOL, TRIG, HDL     ASSESSMENT AND PLAN:  1. Paroxysmal atrial fibrillation: Symptomatically stable. Continue short-acting diltiazem 30 mg prn. CHADSVASC score of 1 (HTN). No indication for anticoagulation.  We again spoke about reduction of alcohol consumption.  He is trying to exercise more and reduce this amount.  2. Essential HTN: Controlled on present therapy.  No changes     Disposition: Follow up 1 year   Prentice Docker, M.D., F.A.C.C.

## 2017-08-30 DIAGNOSIS — S134XXA Sprain of ligaments of cervical spine, initial encounter: Secondary | ICD-10-CM | POA: Diagnosis not present

## 2017-08-30 DIAGNOSIS — S338XXA Sprain of other parts of lumbar spine and pelvis, initial encounter: Secondary | ICD-10-CM | POA: Diagnosis not present

## 2017-08-30 DIAGNOSIS — M546 Pain in thoracic spine: Secondary | ICD-10-CM | POA: Diagnosis not present

## 2017-09-14 ENCOUNTER — Telehealth: Payer: Self-pay | Admitting: Cardiovascular Disease

## 2017-09-14 MED ORDER — DILTIAZEM HCL 60 MG PO TABS: ORAL_TABLET | ORAL | 1 refills | Status: DC

## 2017-09-14 NOTE — Telephone Encounter (Signed)
Can change his prn order to 60mg  po q8hrs PRN palpitations   J Nyx Keady MD

## 2017-09-14 NOTE — Telephone Encounter (Signed)
Been experiencing AFIB spells for at least week. He is questioning if medication needs adjusting.

## 2017-09-14 NOTE — Telephone Encounter (Signed)
Pt voiced understanding - new rx sent to pharmacy as requested.

## 2017-09-14 NOTE — Telephone Encounter (Signed)
Pt says for the last week thinks he has been in and out of afib - c/o increased palpitations and some dizziness - doesn't know what HR/BP has been running - has been taking diltiazem 30 mg as needed and wanted to know if he increase this.

## 2017-10-04 DIAGNOSIS — S338XXA Sprain of other parts of lumbar spine and pelvis, initial encounter: Secondary | ICD-10-CM | POA: Diagnosis not present

## 2017-10-04 DIAGNOSIS — M546 Pain in thoracic spine: Secondary | ICD-10-CM | POA: Diagnosis not present

## 2017-10-04 DIAGNOSIS — S134XXA Sprain of ligaments of cervical spine, initial encounter: Secondary | ICD-10-CM | POA: Diagnosis not present

## 2017-10-11 DIAGNOSIS — S338XXA Sprain of other parts of lumbar spine and pelvis, initial encounter: Secondary | ICD-10-CM | POA: Diagnosis not present

## 2017-10-11 DIAGNOSIS — S134XXA Sprain of ligaments of cervical spine, initial encounter: Secondary | ICD-10-CM | POA: Diagnosis not present

## 2017-10-11 DIAGNOSIS — M546 Pain in thoracic spine: Secondary | ICD-10-CM | POA: Diagnosis not present

## 2017-10-18 DIAGNOSIS — S338XXA Sprain of other parts of lumbar spine and pelvis, initial encounter: Secondary | ICD-10-CM | POA: Diagnosis not present

## 2017-10-18 DIAGNOSIS — S134XXA Sprain of ligaments of cervical spine, initial encounter: Secondary | ICD-10-CM | POA: Diagnosis not present

## 2017-10-18 DIAGNOSIS — M546 Pain in thoracic spine: Secondary | ICD-10-CM | POA: Diagnosis not present

## 2017-10-24 DIAGNOSIS — S338XXA Sprain of other parts of lumbar spine and pelvis, initial encounter: Secondary | ICD-10-CM | POA: Diagnosis not present

## 2017-10-24 DIAGNOSIS — S134XXA Sprain of ligaments of cervical spine, initial encounter: Secondary | ICD-10-CM | POA: Diagnosis not present

## 2017-10-24 DIAGNOSIS — M546 Pain in thoracic spine: Secondary | ICD-10-CM | POA: Diagnosis not present

## 2017-11-06 DIAGNOSIS — S134XXA Sprain of ligaments of cervical spine, initial encounter: Secondary | ICD-10-CM | POA: Diagnosis not present

## 2017-11-06 DIAGNOSIS — M546 Pain in thoracic spine: Secondary | ICD-10-CM | POA: Diagnosis not present

## 2017-11-06 DIAGNOSIS — S338XXA Sprain of other parts of lumbar spine and pelvis, initial encounter: Secondary | ICD-10-CM | POA: Diagnosis not present

## 2017-11-09 DIAGNOSIS — S134XXA Sprain of ligaments of cervical spine, initial encounter: Secondary | ICD-10-CM | POA: Diagnosis not present

## 2017-11-09 DIAGNOSIS — S338XXA Sprain of other parts of lumbar spine and pelvis, initial encounter: Secondary | ICD-10-CM | POA: Diagnosis not present

## 2017-11-09 DIAGNOSIS — M546 Pain in thoracic spine: Secondary | ICD-10-CM | POA: Diagnosis not present

## 2017-11-13 DIAGNOSIS — S134XXA Sprain of ligaments of cervical spine, initial encounter: Secondary | ICD-10-CM | POA: Diagnosis not present

## 2017-11-13 DIAGNOSIS — M546 Pain in thoracic spine: Secondary | ICD-10-CM | POA: Diagnosis not present

## 2017-11-13 DIAGNOSIS — S338XXA Sprain of other parts of lumbar spine and pelvis, initial encounter: Secondary | ICD-10-CM | POA: Diagnosis not present

## 2017-11-20 DIAGNOSIS — S134XXA Sprain of ligaments of cervical spine, initial encounter: Secondary | ICD-10-CM | POA: Diagnosis not present

## 2017-11-20 DIAGNOSIS — S338XXA Sprain of other parts of lumbar spine and pelvis, initial encounter: Secondary | ICD-10-CM | POA: Diagnosis not present

## 2017-11-20 DIAGNOSIS — M546 Pain in thoracic spine: Secondary | ICD-10-CM | POA: Diagnosis not present

## 2018-01-10 DIAGNOSIS — Z23 Encounter for immunization: Secondary | ICD-10-CM | POA: Diagnosis not present

## 2018-01-10 DIAGNOSIS — Z6826 Body mass index (BMI) 26.0-26.9, adult: Secondary | ICD-10-CM | POA: Diagnosis not present

## 2018-01-10 DIAGNOSIS — Z0000 Encounter for general adult medical examination without abnormal findings: Secondary | ICD-10-CM | POA: Diagnosis not present

## 2018-01-10 DIAGNOSIS — E782 Mixed hyperlipidemia: Secondary | ICD-10-CM | POA: Diagnosis not present

## 2018-07-19 ENCOUNTER — Ambulatory Visit (INDEPENDENT_AMBULATORY_CARE_PROVIDER_SITE_OTHER): Payer: BLUE CROSS/BLUE SHIELD | Admitting: Internal Medicine

## 2018-07-19 ENCOUNTER — Other Ambulatory Visit: Payer: Self-pay

## 2018-07-19 ENCOUNTER — Other Ambulatory Visit (INDEPENDENT_AMBULATORY_CARE_PROVIDER_SITE_OTHER): Payer: Self-pay | Admitting: Internal Medicine

## 2018-07-19 ENCOUNTER — Encounter (INDEPENDENT_AMBULATORY_CARE_PROVIDER_SITE_OTHER): Payer: Self-pay | Admitting: Internal Medicine

## 2018-07-19 VITALS — BP 123/81 | HR 85 | Temp 98.3°F | Ht 69.0 in | Wt 187.0 lb

## 2018-07-19 DIAGNOSIS — R197 Diarrhea, unspecified: Secondary | ICD-10-CM | POA: Diagnosis not present

## 2018-07-19 MED ORDER — METRONIDAZOLE 500 MG PO TABS: 500.0000 mg | ORAL_TABLET | Freq: Two times a day (BID) | ORAL | 0 refills | Status: DC

## 2018-07-19 MED ORDER — CIPROFLOXACIN HCL 500 MG PO TABS: 500.0000 mg | ORAL_TABLET | Freq: Two times a day (BID) | ORAL | 0 refills | Status: DC

## 2018-07-19 NOTE — Patient Instructions (Signed)
Labs and GI pathogen.  

## 2018-07-19 NOTE — Addendum Note (Signed)
Addended by: Len Blalock on: 07/19/2018 01:37 PM   Modules accepted: Orders

## 2018-07-19 NOTE — Progress Notes (Addendum)
   Subjective:    Patient ID: Patrick Chase, male    DOB: 09/24/1985, 33 y.o.   MRN: [id number]  HPIStates he has had diarrhea for a couple of weeks. He is having gas pain. Point to umbilical area where the pain is located. He denies any fever. Stools x 6-7 a day.  Appetite has been okay. No weight. No blood in his stool that he can tell.  Denies any fever. Has not been on any recent antibiotics. Seen back in January of 2019 in the ED for colitis. CT scan I January of 2019 revealed:  IMPRESSION: Cecal through distal transverse colonic transmural thickening compatible with acute infectious or inflammatory colitis.  Review of Systems Past Medical History:  Diagnosis Date  . Hypertension     History reviewed. No pertinent surgical history.  No Known Allergies  Current Outpatient Medications on File Prior to Visit  Medication Sig Dispense Refill  . diltiazem (CARDIZEM) 60 MG tablet TAKE 1 TABLET EVERY 8 HOURS AS NEEDED FOR PALPITATIONS 270 tablet 1  . losartan (COZAAR) 50 MG tablet Take 1.5 tablets (75 mg total) by mouth daily. 135 tablet 3   No current facility-administered medications on file prior to visit.         Objective:   Physical Exam Blood pressure 123/81, pulse 85, temperature 98.3 F (36.8 C), height 5\' 9"  (1.753 m), weight 187 lb (84.8 kg). Alert and oriented. Skin warm and dry. Oral mucosa is moist.   . Sclera anicteric, conjunctivae is pink. Thyroid not enlarged. No cervical lymphadenopathy. Lungs clear. Heart regular rate and rhythm.  Abdomen is soft. Bowel sounds are positive. No hepatomegaly. No abdominal masses felt.Slight rt mid abdominal  tenderness.  No edema to lower extremities. Patient is alert and oriented.  States he had a colonoscopy at age 58 for rectal bleeding and was normal by Dr. Karilyn Cota. In 2006 her underwent a colonoscopy for hematochezia witch revealed: 1. Normal terminal ileoscopy. Biopsy: mild chronic,nonspecific proctitis.  2. A single small  erosion at sigmoid colon felt to be nonspecific. 3. Focal proctitis, biopsy obtained for histology. 4. Small internal hemorrhoids.        Assessment & Plan:  Diarrhea: ? Infectious vs inflammatory. Will get a C diff, IBD panel, CRP and CBC. Further recommendations to follow.

## 2018-07-20 LAB — CBC/DIFF AMBIGUOUS DEFAULT
Basophils Absolute: 0.1 10*3/uL (ref 0.0–0.2)
Basos: 1 %
EOS (ABSOLUTE): 0.3 10*3/uL (ref 0.0–0.4)
Eos: 3 %
Hematocrit: 48 % (ref 37.5–51.0)
Hemoglobin: 16.9 g/dL (ref 13.0–17.7)
Immature Grans (Abs): 0 10*3/uL (ref 0.0–0.1)
Immature Granulocytes: 0 %
Lymphocytes Absolute: 2 10*3/uL (ref 0.7–3.1)
Lymphs: 18 %
MCH: 32.6 pg (ref 26.6–33.0)
MCHC: 35.2 g/dL (ref 31.5–35.7)
MCV: 93 fL (ref 79–97)
Monocytes Absolute: 0.9 10*3/uL (ref 0.1–0.9)
Monocytes: 9 %
Neutrophils Absolute: 7.7 10*3/uL — ABNORMAL HIGH (ref 1.4–7.0)
Neutrophils: 69 %
Platelets: 309 10*3/uL (ref 150–450)
RBC: 5.19 x10E6/uL (ref 4.14–5.80)
RDW: 12.2 % (ref 11.6–15.4)
WBC: 11 10*3/uL — ABNORMAL HIGH (ref 3.4–10.8)

## 2018-07-20 LAB — SEDIMENTATION RATE: Sed Rate: 24 mm/hr — ABNORMAL HIGH (ref 0–15)

## 2018-07-21 LAB — CLOSTRIDIUM DIFFICILE BY PCR: Toxigenic C. Difficile by PCR: NEGATIVE

## 2018-07-24 ENCOUNTER — Telehealth (INDEPENDENT_AMBULATORY_CARE_PROVIDER_SITE_OTHER): Payer: Self-pay | Admitting: Internal Medicine

## 2018-07-24 LAB — IBD EXPANDED PANEL
ACCA: 18 units (ref 0–90)
ALCA: 10 units (ref 0–60)
AMCA: 23 units (ref 0–100)
Atypical pANCA: NEGATIVE
gASCA: 3 units (ref 0–50)

## 2018-07-24 NOTE — Telephone Encounter (Signed)
Patient returned your call ph# [phone]

## 2018-07-25 ENCOUNTER — Other Ambulatory Visit (INDEPENDENT_AMBULATORY_CARE_PROVIDER_SITE_OTHER): Payer: Self-pay | Admitting: Internal Medicine

## 2018-07-25 ENCOUNTER — Telehealth (INDEPENDENT_AMBULATORY_CARE_PROVIDER_SITE_OTHER): Payer: Self-pay | Admitting: Internal Medicine

## 2018-07-25 ENCOUNTER — Encounter (INDEPENDENT_AMBULATORY_CARE_PROVIDER_SITE_OTHER): Payer: Self-pay | Admitting: *Deleted

## 2018-07-25 DIAGNOSIS — R197 Diarrhea, unspecified: Secondary | ICD-10-CM

## 2018-07-25 NOTE — Telephone Encounter (Signed)
TCS sch'd 08/02/18, patient aware, will come by office to get instructions & prep kit

## 2018-07-25 NOTE — Telephone Encounter (Signed)
er

## 2018-07-25 NOTE — Telephone Encounter (Signed)
Ann, Colonoscopy 

## 2018-07-26 NOTE — Telephone Encounter (Signed)
I have spoken with patient. He is scheduled for a colonoscopy

## 2018-07-30 ENCOUNTER — Other Ambulatory Visit: Payer: Self-pay

## 2018-07-30 ENCOUNTER — Other Ambulatory Visit (HOSPITAL_COMMUNITY)
Admission: RE | Admit: 2018-07-30 | Discharge: 2018-07-30 | Disposition: A | Discharge status: Home / Self Care | Payer: BC Managed Care – PPO | Source: Ambulatory Visit | Attending: Internal Medicine | Admitting: Internal Medicine

## 2018-07-30 DIAGNOSIS — R197 Diarrhea, unspecified: Secondary | ICD-10-CM | POA: Insufficient documentation

## 2018-07-30 DIAGNOSIS — Z1159 Encounter for screening for other viral diseases: Secondary | ICD-10-CM | POA: Insufficient documentation

## 2018-07-31 LAB — NOVEL CORONAVIRUS, NAA (HOSP ORDER, SEND-OUT TO REF LAB; TAT 18-24 HRS): SARS-CoV-2, NAA: NOT DETECTED

## 2018-08-02 ENCOUNTER — Other Ambulatory Visit: Payer: Self-pay

## 2018-08-02 ENCOUNTER — Ambulatory Visit (HOSPITAL_COMMUNITY)
Admission: RE | Admit: 2018-08-02 | Discharge: 2018-08-02 | Disposition: A | Discharge status: Home / Self Care | Payer: BC Managed Care – PPO | Attending: Internal Medicine | Admitting: Internal Medicine

## 2018-08-02 ENCOUNTER — Encounter (HOSPITAL_COMMUNITY)
Admission: RE | Disposition: A | Discharge status: Home / Self Care | Payer: Self-pay | Source: Home / Self Care | Attending: Internal Medicine

## 2018-08-02 ENCOUNTER — Encounter (HOSPITAL_COMMUNITY): Payer: Self-pay | Admitting: *Deleted

## 2018-08-02 DIAGNOSIS — K529 Noninfective gastroenteritis and colitis, unspecified: Secondary | ICD-10-CM | POA: Insufficient documentation

## 2018-08-02 DIAGNOSIS — K6289 Other specified diseases of anus and rectum: Secondary | ICD-10-CM | POA: Diagnosis not present

## 2018-08-02 DIAGNOSIS — I10 Essential (primary) hypertension: Secondary | ICD-10-CM | POA: Insufficient documentation

## 2018-08-02 DIAGNOSIS — K6389 Other specified diseases of intestine: Secondary | ICD-10-CM | POA: Diagnosis not present

## 2018-08-02 DIAGNOSIS — Z87891 Personal history of nicotine dependence: Secondary | ICD-10-CM | POA: Diagnosis not present

## 2018-08-02 DIAGNOSIS — R197 Diarrhea, unspecified: Secondary | ICD-10-CM | POA: Insufficient documentation

## 2018-08-02 HISTORY — PX: BIOPSY: SHX5522

## 2018-08-02 HISTORY — DX: Unspecified atrial fibrillation: I48.91

## 2018-08-02 HISTORY — PX: COLONOSCOPY: SHX5424

## 2018-08-02 SURGERY — COLONOSCOPY
Anesthesia: Moderate Sedation

## 2018-08-02 MED ORDER — MIDAZOLAM HCL 5 MG/5ML IJ SOLN
INTRAMUSCULAR | Status: AC
  Filled 2018-08-02: qty 10

## 2018-08-02 MED ORDER — MIDAZOLAM HCL 5 MG/5ML IJ SOLN
INTRAMUSCULAR | Status: AC
  Filled 2018-08-02: qty 5

## 2018-08-02 MED ORDER — MIDAZOLAM HCL 5 MG/5ML IJ SOLN
INTRAMUSCULAR | Status: DC | PRN
  Administered 2018-08-02 (×6): 2 mg via INTRAVENOUS

## 2018-08-02 MED ORDER — SODIUM CHLORIDE 0.9 % IV SOLN
INTRAVENOUS | Status: DC
  Administered 2018-08-02: 12:00:00 via INTRAVENOUS

## 2018-08-02 MED ORDER — MEPERIDINE HCL 50 MG/ML IJ SOLN
INTRAMUSCULAR | Status: AC
  Filled 2018-08-02: qty 1

## 2018-08-02 MED ORDER — PROMETHAZINE HCL 25 MG/ML IJ SOLN
INTRAMUSCULAR | Status: AC
  Filled 2018-08-02: qty 1

## 2018-08-02 MED ORDER — MEPERIDINE HCL 50 MG/ML IJ SOLN
INTRAMUSCULAR | Status: DC | PRN
  Administered 2018-08-02 (×4): 25 mg

## 2018-08-02 MED ORDER — PROMETHAZINE HCL 25 MG/ML IJ SOLN
INTRAMUSCULAR | Status: DC | PRN
  Administered 2018-08-02: 12.5 mg via INTRAVENOUS

## 2018-08-02 NOTE — H&P (Signed)
Patrick Chase is an 33 y.o. male.   Chief Complaint: Patient is here for colonoscopy. HPI: Patient is 33 year old Caucasian male who is here for diagnostic colonoscopy.  Back in June 2006 he had colonoscopy was noted to have proctitis.  He responded to topical therapy.  In January last year he was evaluated in emergency room and CT revealed changes of colitis.  He was treated with antibiotics and improved.  Symptoms started few weeks ago.  He had diarrhea and mid and lower abdominal pain.  At the peak of her symptoms he was having he has been he has 15 stools per day.  He is not having 5-6.  He has not a frank bleeding or melena.  States he may have seen blood in tissue sometimes.  He has good appetite and has not lost any weight.  He does not take OTC NSAIDs.  He says he did have a tick bite few days before his symptoms started but he did not experience fever.  He just developed localized redness which gradually resolved. He does not smoke cigarettes but drinks beer intermittently on some days he drinks as many as 6 g/day. Family history is negative for IBD.  Past Medical History:  Diagnosis Date  . Atrial fibrillation ([place])   . Hypertension     History reviewed. No pertinent surgical history.  Family History  Problem Relation Age of Onset  . Hypertension Other    Social History:  reports that he quit smoking about 5 years ago. His smoking use included cigarettes. He started smoking about 20 years ago. He has a 0.60 pack-year smoking history. He has never used smokeless tobacco. He reports current alcohol use of about 6.0 - 8.0 standard drinks of alcohol per week. He reports that he does not use drugs.  Allergies: No Known Allergies  Medications Prior to Admission  Medication Sig Dispense Refill  . diltiazem (CARDIZEM) 60 MG tablet TAKE 1 TABLET EVERY 8 HOURS AS NEEDED FOR PALPITATIONS (Patient taking differently: Take 60 mg by mouth See admin instructions. TAKE 1 TABLET EVERY 8 HOURS AS  NEEDED FOR PALPITATIONS) 270 tablet 1  . losartan (COZAAR) 50 MG tablet Take 1.5 tablets (75 mg total) by mouth daily. 135 tablet 3  . Multiple Vitamins-Minerals (MULTIVITAMIN WITH MINERALS) tablet Take 1 tablet by mouth daily.    . Omega-3 Fatty Acids (FISH OIL) 1000 MG CAPS Take 1,000 mg by mouth daily.    . ciprofloxacin (CIPRO) 500 MG tablet Take 1 tablet (500 mg total) by mouth 2 (two) times daily. (Patient not taking: Reported on 07/30/2018) 28 tablet 0  . metroNIDAZOLE (FLAGYL) 500 MG tablet Take 1 tablet (500 mg total) by mouth 2 (two) times daily. (Patient not taking: Reported on 07/30/2018) 20 tablet 0    No results found for this or any previous visit (from the past 48 hour(s)). No results found.  ROS  Blood pressure 140/90, pulse (!) 101, temperature 98.1 F (36.7 C), temperature source Oral, resp. rate 12, height 5\' 9"  (1.753 m), weight 84.8 kg, SpO2 99 %. Physical Exam  Constitutional: He appears well-developed and well-nourished.  HENT:  Mouth/Throat: Oropharynx is clear and moist.  Eyes: Conjunctivae are normal. No scleral icterus.  Neck: No thyromegaly present.  Cardiovascular: Normal rate, regular rhythm and normal heart sounds.  No murmur heard. Respiratory: Effort normal and breath sounds normal.  GI: Soft. He exhibits no distension and no mass. There is no abdominal tenderness.  Musculoskeletal:  General: No edema.  Lymphadenopathy:    He has no cervical adenopathy.  Neurological: He is alert.  Skin: Skin is warm and dry.     Assessment/Plan Diarrhea. History of colitis in January 2019. Diagnostic colonoscopy.  Patrick Chase , MD 08/02/2018, 12:29 PM

## 2018-08-02 NOTE — OR Nursing (Signed)
Due to COVID-19 restrictions, discharge instructions discussed with wife, Eliceo Gladu, via telephone. Discharge instructions sent with patient. Mother to pick patient up. Wife verbalized understanding and denies any further questions.

## 2018-08-02 NOTE — Op Note (Signed)
[facility] Patient Name: Patrick Chase Procedure Date: 08/02/2018 11:23 AM MRN: [id number] Date of Birth: Nov 06, 1985 Attending MD: Lionel December , MD CSN: [id number] Age: 33 Admit Type: Outpatient Procedure:                Colonoscopy Indications:              Diarrhea Providers:                Lionel December, MD, Jannett Celestine, RN, Pandora Leiter,                            Technician Referring MD:              Medicines:                Promethazine 12.5 mg IV, Meperidine 100 mg IV,                            Midazolam 12 mg IV Complications:            No immediate complications. Estimated Blood Loss:     Estimated blood loss was minimal. Procedure:                Pre-Anesthesia Assessment:                           - Prior to the procedure, a History and Physical                            was performed, and patient medications and                            allergies were reviewed. The patient's tolerance of                            previous anesthesia was also reviewed. The risks                            and benefits of the procedure and the sedation                            options and risks were discussed with the patient.                            All questions were answered, and informed consent                            was obtained. Prior Anticoagulants: The patient has                            taken no previous anticoagulant or antiplatelet                            agents. ASA Grade Assessment: II - A patient with  mild systemic disease. After reviewing the risks                            and benefits, the patient was deemed in                            satisfactory condition to undergo the procedure.                           After obtaining informed consent, the colonoscope                            was passed under direct vision. Throughout the                            procedure, the patient's blood pressure, pulse, and                        oxygen saturations were monitored continuously. The                            [id number] ([id number]) was introduced through the                            anus and advanced to the the terminal ileum, with                            identification of the appendiceal orifice and IC                            valve. The colonoscopy was performed without                            difficulty. The patient tolerated the procedure                            well. The quality of the bowel preparation was                            excellent. The terminal ileum, ileocecal valve,                            appendiceal orifice, and rectum were photographed. Scope In: 12:49:39 PM Scope Out: 1:19:34 PM Scope Withdrawal Time: 0 hours 19 minutes 49 seconds  Total Procedure Duration: 0 hours 29 minutes 55 seconds  Findings:      The perianal and digital rectal examinations were normal.      The terminal ileum appeared normal.      The descending colon, proximal transverse colon, distal transverse       colon, hepatic flexure, ascending colon, cecum, appendiceal orifice and       ileocecal valve appeared normal.      A localized area of mildly erythematous mucosa with punctate erosions       was found in the mid transverse colon. This was biopsied with a cold  forceps for histology. The pathology specimen was placed into Bottle       Number 1.      An area of mildly congested mucosa erythematous mucosa with puntate       erosions was found in the sigmoid colon. This was biopsied with a cold       forceps for histology. The pathology specimen was placed into Bottle       Number 1.      An area of mildly congested mucosa was found in the rectum. This was       biopsied with a cold forceps for histology. The pathology specimen was       placed into Bottle Number 3.      No additional abnormalities were found on retroflexion. Impression:               - The examined portion of the  ileum was normal.                           - The descending colon, proximal transverse colon,                            distal transverse colon, hepatic flexure, ascending                            colon, cecum, appendiceal orifice and ileocecal                            valve are normal.                           - Erythematous mucosa in the mid transverse colon.                            Biopsied.                           - Mild colitis involving sigmoid colon and rectum.                           - Stool sample sent for GI pathogen panel. Moderate Sedation:      Moderate (conscious) sedation was administered by the endoscopy nurse       and supervised by the endoscopist. The following parameters were       monitored: oxygen saturation, heart rate, blood pressure, CO2       capnography and response to care. Total physician intraservice time was       42 minutes. Recommendation:           - Patient has a contact number available for                            emergencies. The signs and symptoms of potential                            delayed complications were discussed with the  patient. Return to normal activities tomorrow.                            Written discharge instructions were provided to the                            patient.                           - Low fiber diet today.                           - Continue present medications.                           - No aspirin, ibuprofen, naproxen, or other                            non-steroidal anti-inflammatory drugs for 1 day.                           - Await results of stool test and pathology. Procedure Code(s):        --- Professional ---                           [phone], Colonoscopy, flexible; with biopsy, single                            or multiple                           99153, Moderate sedation; each additional 15                            minutes intraservice time                            99153, Moderate sedation; each additional 15                            minutes intraservice time                           G0500, Moderate sedation services provided by the                            same physician or other qualified health care                            professional performing a gastrointestinal                            endoscopic service that sedation supports,                            requiring the presence of an independent trained  observer to assist in the monitoring of the                            patient's level of consciousness and physiological                            status; initial 15 minutes of intra-service time;                            patient age 80 years or older (additional time may                            be reported with [id number], as appropriate) Diagnosis Code(s):        --- Professional ---                           K63.89, Other specified diseases of intestine                           K62.89, Other specified diseases of anus and rectum                           R19.7, Diarrhea, unspecified CPT copyright 2019 American Medical Association. All rights reserved. The codes documented in this report are preliminary and upon coder review may  be revised to meet current compliance requirements. Lionel DecemberNajeeb Rehman, MD Lionel DecemberNajeeb Rehman, MD 08/02/2018 1:35:18 PM This report has been signed electronically. Number of Addenda: 0

## 2018-08-02 NOTE — Discharge Instructions (Signed)
No aspirin or NSAIDs for 24 hours. Resume usual medications and diet as before. No driving for 24 hours. Physician will call with results of stool test and biopsy.   Colonoscopy, Adult, Care After This sheet gives you information about how to care for yourself after your procedure. Your doctor may also give you more specific instructions. If you have problems or questions, call your doctor. What can I expect after the procedure? After the procedure, it is common to have:  A small amount of blood in your poop for 24 hours.  Some gas.  Mild cramping or bloating in your belly. Follow these instructions at home: General instructions  For the first 24 hours after the procedure: ? Do not drive or use machinery. ? Do not sign important documents. ? Do not drink alcohol. ? Do your daily activities more slowly than normal. ? Eat foods that are soft and easy to digest.  Take over-the-counter or prescription medicines only as told by your doctor. To help cramping and bloating:   Try walking around.  Put heat on your belly (abdomen) as told by your doctor. Use a heat source that your doctor recommends, such as a moist heat pack or a heating pad. ? Put a towel between your skin and the heat source. ? Leave the heat on for 20-30 minutes. ? Remove the heat if your skin turns bright red. This is especially important if you cannot feel pain, heat, or cold. You can get burned. Eating and drinking   Drink enough fluid to keep your pee (urine) clear or pale yellow.  Return to your normal diet as told by your doctor. Avoid heavy or fried foods that are hard to digest.  Avoid drinking alcohol for as long as told by your doctor. Contact a doctor if:  You have blood in your poop (stool) 2-3 days after the procedure. Get help right away if:  You have more than a small amount of blood in your poop.  You see large clumps of tissue (blood clots) in your poop.  Your belly is swollen.  You  feel sick to your stomach (nauseous).  You throw up (vomit).  You have a fever.  You have belly pain that gets worse, and medicine does not help your pain. Summary  After the procedure, it is common to have a small amount of blood in your poop. You may also have mild cramping and bloating in your belly.  For the first 24 hours after the procedure, do not drive or use machinery, do not sign important documents, and do not drink alcohol.  Get help right away if you have a lot of blood in your poop, feel sick to your stomach, have a fever, or have more belly pain. This information is not intended to replace advice given to you by your health care provider. Make sure you discuss any questions you have with your health care provider. Document Released: 03/12/2010 Document Revised: 12/08/2016 Document Reviewed: 11/02/2015 Elsevier Interactive Patient Education  2019 [employer].

## 2018-08-03 ENCOUNTER — Encounter (HOSPITAL_COMMUNITY): Payer: Self-pay | Admitting: Internal Medicine

## 2018-08-03 LAB — GASTROINTESTINAL PANEL BY PCR, STOOL (REPLACES STOOL CULTURE)

## 2018-08-07 ENCOUNTER — Encounter (INDEPENDENT_AMBULATORY_CARE_PROVIDER_SITE_OTHER): Payer: Self-pay | Admitting: Internal Medicine

## 2018-08-07 ENCOUNTER — Other Ambulatory Visit (INDEPENDENT_AMBULATORY_CARE_PROVIDER_SITE_OTHER): Payer: Self-pay | Admitting: Internal Medicine

## 2018-08-07 ENCOUNTER — Other Ambulatory Visit: Payer: Self-pay | Admitting: Cardiovascular Disease

## 2018-08-07 MED ORDER — MESALAMINE 1.2 G PO TBEC: 2.4000 g | DELAYED_RELEASE_TABLET | Freq: Two times a day (BID) | ORAL | 11 refills | Status: DC

## 2018-08-16 ENCOUNTER — Ambulatory Visit (INDEPENDENT_AMBULATORY_CARE_PROVIDER_SITE_OTHER): Payer: BC Managed Care – PPO | Admitting: Physician Assistant

## 2018-08-16 ENCOUNTER — Telehealth (INDEPENDENT_AMBULATORY_CARE_PROVIDER_SITE_OTHER): Payer: Self-pay | Admitting: *Deleted

## 2018-08-16 ENCOUNTER — Encounter: Payer: Self-pay | Admitting: Physician Assistant

## 2018-08-16 ENCOUNTER — Other Ambulatory Visit: Payer: Self-pay

## 2018-08-16 VITALS — BP 112/78 | HR 73 | Ht 72.0 in | Wt 192.0 lb

## 2018-08-16 DIAGNOSIS — I480 Paroxysmal atrial fibrillation: Secondary | ICD-10-CM

## 2018-08-16 MED ORDER — METOPROLOL TARTRATE 25 MG PO TABS: 12.5000 mg | ORAL_TABLET | Freq: Two times a day (BID) | ORAL | 1 refills | Status: DC

## 2018-08-16 MED ORDER — DILTIAZEM HCL 60 MG PO TABS: ORAL_TABLET | ORAL | 1 refills | Status: DC

## 2018-08-16 MED ORDER — DILTIAZEM HCL ER COATED BEADS 120 MG PO CP24: 120.0000 mg | ORAL_CAPSULE | Freq: Every day | ORAL | 1 refills | Status: DC

## 2018-08-16 NOTE — Progress Notes (Signed)
Cardiology Office Note   Date:  08/16/2018   ID:  Patrick Chase, DOB 12/07/1985, MRN [id number]  PCP:  Patient, [name] Has been to [facility] Cardiologist:  Prentice DockerSuresh Koneswaran, MD 07/14/2017 Patrick Demarkhonda Elfida Shimada, PA-C    History of Present Illness: Patrick Chase is a 33 y.o. male with a history of PAF, HTN, nl EF 2016 echo, colitis  Patrick Chase presents for cardiology follow up.  He has been having flares of the atrial fib. Spoke w/ Dr Cresenzo>>cards appt made and BB samples given.   Is aware that cholesterol is high, is taking fish oil and eating better.   Had colonoscopy 2 weeks ago, was in Afib at that time, thinks HR was up to 200 at one point.   The Afib makes him tired, gets SOB if his HR is high.   He had not noticed it until right before his colonoscopy. Before that he had not noticed it for a while. The colonoscopy was done for diarrhea, > 10 episodes a day, rx for ulcerative colitis and improved. His stomach has not completely cleared up.   Feels the sx were helped by him working out and doing cardio, has not been doing that recently.   Feels palpitations every day, they are rapid most of the time.   He is taking the Cardizem 60 mg q am plus the BB 1/4th pill.   His sx have not been this bad since it was first diagnosed.   He was not drinking during the week, a little on weekends. Is back up to a 6-pack on weekdays, more on weekends.   The beta-blocker that he was given was bisoprolol 10 mg.   Past Medical History:  Diagnosis Date  . Atrial fibrillation ([facility])   . Hypertension     Past Surgical History:  Procedure Laterality Date  . BIOPSY  08/02/2018   Procedure: BIOPSY;  Surgeon: Malissa Hippoehman, Najeeb U, MD;  Location: [facility];  Service: Endoscopy;;  . COLONOSCOPY N/A 08/02/2018   Procedure: COLONOSCOPY;  Surgeon: Malissa Hippoehman, Najeeb U, MD;  Location: [facility];  Service: Endoscopy;  Laterality: N/A;  1230pm    Current Outpatient Medications   Medication Sig Dispense Refill  . diltiazem (CARDIZEM) 60 MG tablet TAKE 1 TABLET EVERY 8 HOURS AS NEEDED FOR PALPITATIONS (Patient taking differently: Take 60 mg by mouth See admin instructions. TAKE 1 TABLET EVERY 8 HOURS AS NEEDED FOR PALPITATIONS) 270 tablet 1  . losartan (COZAAR) 50 MG tablet TAKE ONE AND ONE-HALF TABLETS (75MG  TOTAL) BY MOUTH DAILY 135 tablet 0  . mesalamine (LIALDA) 1.2 g EC tablet Take 2 tablets (2.4 g total) by mouth 2 (two) times daily. 120 tablet 11  . Multiple Vitamins-Minerals (MULTIVITAMIN WITH MINERALS) tablet Take 1 tablet by mouth daily.    . Omega-3 Fatty Acids (FISH OIL) 1000 MG CAPS Take 1,000 mg by mouth daily.     No current facility-administered medications for this visit.     Allergies:   Patient has no known allergies.    Social History:  The patient  reports that he quit smoking about 5 years ago. His smoking use included cigarettes. He started smoking about 20 years ago. He has a 0.60 pack-year smoking history. He has never used smokeless tobacco. He reports current alcohol use of about 6.0 - 8.0 standard drinks of alcohol per week. He reports that he does not use drugs.   Family History:  The patient's family history  includes Hypertension in an other family member.  He indicated that only one of his two others is alive.   ROS:  Please see the history of present illness. All other systems are reviewed and negative.   PHYSICAL EXAM: VS:  BP 112/78   Pulse 73   Ht 6' (1.829 m)   Wt 192 lb (87.1 kg)   SpO2 98%   BMI 26.04 kg/m  , BMI Body mass index is 26.04 kg/m. GEN: Well nourished, well developed, male in no acute distress HEENT: normal for age  Neck: no JVD, no carotid bruit, no masses Cardiac: RRR; no murmur, no rubs, or gallops Respiratory:  clear to auscultation bilaterally, normal work of breathing GI: soft, nontender, nondistended, + BS MS: no deformity or atrophy; no edema; distal pulses are 2+ in all 4 extremities  Skin: warm  and dry, no rash Neuro:  Strength and sensation are intact Psych: euthymic mood, full affect   EKG:  EKG is ordered today. The ekg ordered today demonstrates sinus rhythm, no acute ischemic changes  ECHO: 05/12/2014 - Left ventricle: The cavity size was normal. There was mild  concentric hypertrophy. Systolic function was normal. The  estimated ejection fraction was in the range of 55% to 60%. Wall  motion was normal; there were no regional wall motion  abnormalities. Left ventricular diastolic function parameters  were normal.  - Aorta: Aortic root dimension: 39 mm (ED).  - Aortic root: The aortic root was borderline dilated.   Recent Labs: 07/19/2018: Hemoglobin 16.9; Platelets 309  CBC    Component Value Date/Time   WBC 11.0 (H) 07/19/2018 1412   WBC 9.8 03/07/2017 1627   RBC 5.19 07/19/2018 1412   RBC 4.65 03/07/2017 1627   HGB 16.9 07/19/2018 1412   HCT 48.0 07/19/2018 1412   PLT 309 07/19/2018 1412   MCV 93 07/19/2018 1412   MCH 32.6 07/19/2018 1412   MCH 32.5 03/07/2017 1627   MCHC 35.2 07/19/2018 1412   MCHC 35.9 03/07/2017 1627   RDW 12.2 07/19/2018 1412   LYMPHSABS 2.0 07/19/2018 1412   MONOABS 1.1 (H) 05/01/2014 1215   EOSABS 0.3 07/19/2018 1412   BASOSABS 0.1 07/19/2018 1412   CMP Latest Ref Rng & Units 03/02/2017 02/27/2017 05/01/2014  Glucose 65 - 139 mg/dL 87 157(H) 93  BUN 7 - 25 mg/dL 8 9 16   Creatinine 0.60 - 1.35 mg/dL 1.04 0.95 0.90  Sodium 135 - 146 mmol/L 138 138 139  Potassium 3.5 - 5.3 mmol/L 4.7 3.8 3.6  Chloride 98 - 110 mmol/L 103 103 104  CO2 20 - 32 mmol/L 28 24 27   Calcium 8.6 - 10.3 mg/dL 9.2 9.1 9.0  Total Protein 6.1 - 8.1 g/dL 6.4 7.4 7.2  Total Bilirubin 0.2 - 1.2 mg/dL 0.5 0.4 1.0  Alkaline Phos 38 - 126 U/L - 83 67  AST 10 - 40 U/L 15 20 17   ALT 9 - 46 U/L 23 33 28     Lipid Panel No results found for: CHOL, HDL, LDLCALC, LDLDIRECT, TRIG, CHOLHDL    Wt Readings from Last 3 Encounters:  08/16/18 192 lb (87.1 kg)   08/02/18 187 lb (84.8 kg)  07/19/18 187 lb (84.8 kg)     Other studies Reviewed: Additional studies/ records that were reviewed today include: office notes, hospital records and testing.  ASSESSMENT AND PLAN:  1.  PAF:  - episodes are more frequent and longer in duration - CHA2DS2-VASc = 0, no anticoag indicated at this time -  advised to decreased ETOH - Dr. Marzella Schleinresenzo-Dishmon mentioned ablation to him, and he is interested - Advised since what ever we would be treating this for the next 50 years or more, medical therapy may be the best option right now - He is having more frequent episodes and is more symptomatic - Refer to A. fib clinic ASAP for management -For now, change his beta-blocker to metoprolol 12.5 mg twice daily and the Cardizem to Cardizem CD 120 mg daily   Current medicines are reviewed at length with the patient today.  The patient does not have concerns regarding medicines.  The following changes have been made: Change the beta-blocker and change the Cardizem to CD  Labs/ tests ordered today include:  No orders of the defined types were placed in this encounter.    Disposition:   FU with Prentice DockerSuresh Koneswaran, MD  Signed, Patrick Demarkhonda Su Duma, PA-C  08/16/2018 3:52 PM    [facility] Phone: [phone]; Fax: [phone]

## 2018-08-16 NOTE — Patient Instructions (Addendum)
Medication Instructions:    Your physician has recommended you make the following change in your medication:   Start metoprolol tartrate 12.5 mg by mouth twice daily  Change cardizem cd 120 mg daily  Cardizem 60 mg can be taken 1/2-1 tablet as needed for palpitations  Continue all other medications the same  Labwork:  Your physician recommends that you return for lab work TODAY to check your BMET & TSH. You may have this done at [facility].  Testing/Procedures:  NONE  Follow-Up:  Your physician recommends that you schedule an appointment with our [facility]. Your appointment has been scheduled for tomorrow 08/17/2018 @2 :00 pm with Clint Fenton PA.  Located at [address] (Entrance C) Parking Deck code is [phone]  Any Other Special Instructions Will Be Listed Below (If Applicable).  Limit alcohol to no more than one beer per day.  If you need a refill on your cardiac medications before your next appointment, please call your pharmacy.

## 2018-08-16 NOTE — Telephone Encounter (Signed)
Patient needs OV sometime around 09/01/18 from proceudre

## 2018-08-17 ENCOUNTER — Other Ambulatory Visit: Payer: Self-pay

## 2018-08-17 ENCOUNTER — Ambulatory Visit (HOSPITAL_COMMUNITY)
Admission: RE | Admit: 2018-08-17 | Discharge: 2018-08-17 | Disposition: A | Discharge status: Home / Self Care | Payer: BC Managed Care – PPO | Source: Ambulatory Visit | Attending: Physician Assistant | Admitting: Physician Assistant

## 2018-08-17 ENCOUNTER — Encounter (HOSPITAL_COMMUNITY): Payer: Self-pay | Admitting: Physician Assistant

## 2018-08-17 VITALS — BP 134/82 | HR 85 | Ht 72.0 in | Wt 189.0 lb

## 2018-08-17 DIAGNOSIS — Z79899 Other long term (current) drug therapy: Secondary | ICD-10-CM | POA: Insufficient documentation

## 2018-08-17 DIAGNOSIS — I10 Essential (primary) hypertension: Secondary | ICD-10-CM | POA: Diagnosis not present

## 2018-08-17 DIAGNOSIS — Z8719 Personal history of other diseases of the digestive system: Secondary | ICD-10-CM | POA: Insufficient documentation

## 2018-08-17 DIAGNOSIS — I480 Paroxysmal atrial fibrillation: Secondary | ICD-10-CM | POA: Diagnosis not present

## 2018-08-17 DIAGNOSIS — I493 Ventricular premature depolarization: Secondary | ICD-10-CM | POA: Diagnosis not present

## 2018-08-17 DIAGNOSIS — Z8249 Family history of ischemic heart disease and other diseases of the circulatory system: Secondary | ICD-10-CM | POA: Insufficient documentation

## 2018-08-17 DIAGNOSIS — F1010 Alcohol abuse, uncomplicated: Secondary | ICD-10-CM | POA: Diagnosis not present

## 2018-08-17 MED ORDER — DILTIAZEM HCL 60 MG PO TABS: ORAL_TABLET | ORAL | 1 refills | Status: DC

## 2018-08-17 MED ORDER — METOPROLOL TARTRATE 25 MG PO TABS: 25.0000 mg | ORAL_TABLET | Freq: Two times a day (BID) | ORAL | 1 refills | Status: DC

## 2018-08-17 MED ORDER — METOPROLOL TARTRATE 25 MG PO TABS: 25.0000 mg | ORAL_TABLET | Freq: Two times a day (BID) | ORAL | 3 refills | Status: DC

## 2018-08-17 NOTE — Progress Notes (Signed)
Primary Care Physician: Patient, [name] Primary Cardiologist: Dr Purvis SheffieldKoneswaran Primary Electrophysiologist: none Referring Physician: Theodore Demarkhonda Barrett PA-C   Patrick Chase is a 33 y.o. male with a history of paroxysmal atrial fibrillation, ulcerative colitis, and HTN who presents for [facility].  The patient was initially diagnosed with atrial fibrillation in 2016 after presenting with symptoms of palpitations and presented to ER in afib with RVR. He has done well on BB therapy until recently he has had more episodes of palpitations, particularly worse after his colonoscopy. He was seen by Theodore Demarkhonda Barrett yesterday who adjusted his BB and CCB. He is not on anticoagulation with a low CHADS2VASC score. He does admit to significant alcohol use (6-8 beers daily with more on weekends). He denies snoring.  Today, he denies symptoms of chest pain, shortness of breath, orthopnea, PND, lower extremity edema, dizziness, presyncope, syncope, snoring, daytime somnolence, bleeding, or neurologic sequela. The patient is tolerating medications without difficulties and is otherwise without complaint today. +palpitations   Atrial Fibrillation Risk Factors:  he does not have symptoms or diagnosis of sleep apnea. he does not have a history of rheumatic fever. he does have a history of alcohol use. The patient does not have a history of early familial atrial fibrillation or other arrhythmias.  he has a BMI of Body mass index is 25.63 kg/m.[name]. Filed Weights   08/17/18 1414  Weight: 85.7 kg    Family History  Problem Relation Age of Onset  . Hypertension Other      Atrial Fibrillation Management history:  Previous antiarrhythmic drugs: none Previous cardioversions: none Previous ablations: none CHADS2VASC score: 1 Anticoagulation history: none   Past Medical History:  Diagnosis Date  . Atrial fibrillation (HCC)   . Hypertension    Past Surgical  History:  Procedure Laterality Date  . BIOPSY  08/02/2018   Procedure: BIOPSY;  Surgeon: Malissa Hippoehman, Najeeb U, MD;  [facility];  Service: Endoscopy;;  . COLONOSCOPY N/A 08/02/2018   Procedure: COLONOSCOPY;  Surgeon: Malissa Hippoehman, Najeeb U, MD;  [facility];  Service: Endoscopy;  Laterality: N/A;  1230pm    Current Outpatient Medications  Medication Sig Dispense Refill  . diltiazem (CARDIZEM CD) 120 MG 24 hr capsule Take 1 capsule (120 mg total) by mouth daily. 90 capsule 1  . mesalamine (LIALDA) 1.2 g EC tablet Take 2 tablets (2.4 g total) by mouth 2 (two) times daily. 120 tablet 11  . metoprolol tartrate (LOPRESSOR) 25 MG tablet Take 1 tablet (25 mg total) by mouth 2 (two) times daily. 60 tablet 3  . Multiple Vitamins-Minerals (MULTIVITAMIN WITH MINERALS) tablet Take 1 tablet by mouth daily.    . Omega-3 Fatty Acids (FISH OIL) 1000 MG CAPS Take 1,000 mg by mouth daily.    [name]. diltiazem (CARDIZEM) 60 MG tablet Take 1/2 tablet every 4 hours AS NEEDED for AFIB heart rate >100 270 tablet 1   No current facility-administered medications for this encounter.     No Known Allergies  Social History   Socioeconomic History  . Marital status: Married    Spouse name: Not on file  . Number of children: Not on file  . Years of education: Not on file  . Highest education level: Not on file  Occupational History  . Not on file  Social Needs  . Financial resource strain: Not on file  . Food insecurity    Worry: Not on file    Inability:  Not on file  . Transportation needs    Medical: Not on file    Non-medical: Not on file  Tobacco Use  . Smoking status: Former Smoker    Packs/day: 0.05    Years: 12.00    Pack years: 0.60    Types: Cigarettes    Start date: 02/21/1998    Quit date: 07/15/2013    Years since quitting: 5.0  . Smokeless tobacco: Never Used  Substance and Sexual Activity  . Alcohol use: Yes    Alcohol/week: 6.0 - 8.0 standard drinks    Types: 6 - 8 Cans of  beer per week  . Drug use: No  . Sexual activity: Not on file  Lifestyle  . Physical activity    Days per week: Not on file    Minutes per session: Not on file  . Stress: Not on file  Relationships  . Social Musicianconnections    Talks on phone: Not on file    Gets together: Not on file    Attends religious service: Not on file    Active member of club or organization: Not on file    Attends meetings of clubs or organizations: Not on file    Relationship status: Not on file  . Intimate partner violence    Fear of current or ex partner: Not on file    Emotionally abused: Not on file    Physically abused: Not on file    Forced sexual activity: Not on file  Other Topics Concern  . Not on file  Social History Narrative  . Not on file     ROS- All systems are reviewed and negative except as per the HPI above.  Physical Exam: Vitals:   08/17/18 1414  BP: 134/82  Pulse: 85  Weight: 85.7 kg  Height: 6' (1.829 m)    GEN- The patient is well appearing, alert and oriented x 3 today.   Head- normocephalic, atraumatic Eyes-  Sclera clear, conjunctiva pink Ears- hearing intact Oropharynx- clear Neck- supple  Lungs- Clear to ausculation bilaterally, normal work of breathing Heart- Regular rate and rhythm, with frequent ectopic beats, no murmurs, rubs or gallops  GI- soft, NT, ND, + BS Extremities- no clubbing, cyanosis, or edema MS- no significant deformity or atrophy Skin- no rash or lesion Psych- euthymic mood, full affect Neuro- strength and sensation are intact  Wt Readings from Last 3 Encounters:  08/17/18 85.7 kg  08/16/18 87.1 kg  08/02/18 84.8 kg    EKG today demonstrates SR HR 85, PVC couplets, PR 114, QRS 94, QTc 449  Echo- 05/12/14 Left ventricle: The cavity size was normal. There was mild  concentric hypertrophy. Systolic function was normal. The  estimated ejection fraction was in the range of 55% to 60%. Wall  motion was normal; there were no regional  wall motion  abnormalities. Left ventricular diastolic function parameters  were normal.  - Aorta: Aortic root dimension: 39 mm (ED).  - Aortic root: The aortic root was borderline dilated.   Epic records are reviewed at length today  Assessment and Plan:  1. Paroxysmal atrial fibrillation The patient has paroxysmal atrial fibrillation. General education about afib discussed and questions answered. Also discussed his stroke risk and the risks and benefits of anticoagulation. He prefers not to be on anticoagulation at this time given low risk. Will increase his metoprolol to 25 mg BID Continue diltiazem 120 mg daily with 30 mg PRN q4hrs for heart racing. Will check echocardiogram. Lifestyle changes as  below and including regular physical activity.   This patients CHA2DS2-VASc Score and unadjusted Ischemic Stroke Rate (% per year) is equal to 0.6 % stroke rate/year from a score of 1  Above score calculated as 1 point each if present [CHF, HTN, DM, Vascular=MI/PAD/Aortic Plaque, Age if 65-74, or Male] Above score calculated as 2 points each if present [Age > 75, or Stroke/TIA/TE]   2. HTN Will stop losartan today to make room for BB and CCB.  3. EtOH Abuse Likely contributing to the overall clinical picture. 6-8 beers daily with more on weekends. Encouraged him to slowly cut back with a goal of 2 drinks per week.  4. PVCs Frequent PVC couplets noted on ECG and heard on physical exam, new finding. Echocardiogram as above.  Metoprolol as above. Will refer to EP for evaluation.   Follow up with EP in the next month.   [facility] [address] [place], [place] [address] [phone] 08/17/2018 3:25 PM

## 2018-08-17 NOTE — Patient Instructions (Addendum)
Stop losartan  Start metoprolol 25mg  twice a day  Cardizem 60mg  -- take 1/2 tablet every 4 hours AS NEEDED for AFIB heart rate >100 as long as blood pressure >100.   Scheduling will be in touch with you to set up consultation with EP

## 2018-08-20 ENCOUNTER — Encounter: Payer: Self-pay | Admitting: *Deleted

## 2018-08-20 ENCOUNTER — Telehealth: Payer: Self-pay | Admitting: *Deleted

## 2018-08-20 NOTE — Telephone Encounter (Signed)
-----   Message from Lonn Georgia, PA-C sent at 08/20/2018  3:46 PM EDT ----- Please let him know that his labs are fine.  K+ borderline, so he needs to eat more foods w/ K+ in them, especially when he exercises. Thanks

## 2018-08-20 NOTE — Telephone Encounter (Signed)
Patient informed. 

## 2018-09-05 ENCOUNTER — Other Ambulatory Visit: Payer: Self-pay

## 2018-09-05 ENCOUNTER — Ambulatory Visit (HOSPITAL_COMMUNITY)
Admission: RE | Admit: 2018-09-05 | Discharge: 2018-09-05 | Disposition: A | Discharge status: Home / Self Care | Payer: BC Managed Care – PPO | Source: Ambulatory Visit | Attending: Physician Assistant | Admitting: Physician Assistant

## 2018-09-05 DIAGNOSIS — I480 Paroxysmal atrial fibrillation: Secondary | ICD-10-CM

## 2018-09-05 NOTE — Progress Notes (Signed)
*  PRELIMINARY RESULTS* Echocardiogram 2D Echocardiogram has been performed.  Patrick Chase 09/05/2018, 1:51 PM

## 2018-09-12 ENCOUNTER — Encounter (HOSPITAL_COMMUNITY): Payer: Self-pay | Admitting: *Deleted

## 2018-09-25 ENCOUNTER — Other Ambulatory Visit: Payer: Self-pay

## 2018-09-25 DIAGNOSIS — Z20822 Contact with and (suspected) exposure to covid-19: Secondary | ICD-10-CM

## 2018-09-25 DIAGNOSIS — R6889 Other general symptoms and signs: Secondary | ICD-10-CM | POA: Diagnosis not present

## 2018-09-26 LAB — NOVEL CORONAVIRUS, NAA: SARS-CoV-2, NAA: NOT DETECTED

## 2018-10-09 ENCOUNTER — Encounter: Payer: Self-pay | Admitting: Internal Medicine

## 2018-10-16 ENCOUNTER — Ambulatory Visit: Payer: BC Managed Care – PPO | Admitting: Internal Medicine

## 2018-10-30 ENCOUNTER — Ambulatory Visit: Payer: BC Managed Care – PPO | Admitting: Cardiology

## 2018-10-30 ENCOUNTER — Other Ambulatory Visit: Payer: Self-pay

## 2018-10-30 ENCOUNTER — Encounter: Payer: Self-pay | Admitting: Cardiology

## 2018-10-30 VITALS — BP 116/78 | HR 75 | Ht 72.0 in | Wt 186.0 lb

## 2018-10-30 DIAGNOSIS — I493 Ventricular premature depolarization: Secondary | ICD-10-CM | POA: Diagnosis not present

## 2018-10-30 MED ORDER — DILTIAZEM HCL ER COATED BEADS 180 MG PO CP24: 180.0000 mg | ORAL_CAPSULE | Freq: Every day | ORAL | 6 refills | Status: DC

## 2018-10-30 NOTE — Progress Notes (Signed)
Electrophysiology Office Note   Date:  10/30/2018   ID:  Patrick Chase, DOB May 19, 1985, MRN [id number]  PCP:  Patient, [name]  Cardiologist:  Purvis SheffieldKoneswaran Primary Electrophysiologist:  Carylon Tamburro Jorja LoaMartin Ryker Pherigo, MD    Chief Complaint: AF   History of Present Illness: Patrick Chase is a 33 y.o. male who is being seen today for the evaluation of AF at the request of Fenton, Clint R, PA. Presenting today for electrophysiology evaluation.  He has a history of paroxysmal atrial fibrillation, ulcerative colitis, and hypertension.  He was diagnosed with atrial fibrillation in 2016 when he presented to the emergency room with rapid rates.  He was started on a beta-blocker at that time.  He has had more episodes of palpitations over the past few months.  He does admit to significant alcohol use with 6-8 beers on a daily basis.  He does drink more on the weekends.  Today, he denies symptoms of palpitations, chest pain, shortness of breath, orthopnea, PND, lower extremity edema, claudication, dizziness, presyncope, syncope, bleeding, or neurologic sequela. The patient is tolerating medications without difficulties.  He continues to have episodes of atrial fibrillation.  They do occur rarely.  His most recent episode was a few weeks ago.  He was in and out for the entire day.  Once he got home he took his medications, and he popped back into normal rhythm.  He has been cutting back on his alcohol.  He does recognize that alcohol certainly contributes to his atrial fibrillation.   Past Medical History:  Diagnosis Date  . Atrial fibrillation (HCC)   . Hypertension    Past Surgical History:  Procedure Laterality Date  . BIOPSY  08/02/2018   Procedure: BIOPSY;  Surgeon: Malissa Hippoehman, Najeeb U, MD;  [facility];  Service: Endoscopy;;  . COLONOSCOPY N/A 08/02/2018   Procedure: COLONOSCOPY;  Surgeon: Malissa Hippoehman, Najeeb U, MD;  [facility];  Service: Endoscopy;  Laterality: N/A;  1230pm      Current Outpatient Medications  Medication Sig Dispense Refill  . diltiazem (CARDIZEM) 60 MG tablet Take 1/2 tablet every 4 hours AS NEEDED for AFIB heart rate >100 270 tablet 1  . mesalamine (LIALDA) 1.2 g EC tablet Take 2 tablets (2.4 g total) by mouth 2 (two) times daily. 120 tablet 11  . Multiple Vitamins-Minerals (MULTIVITAMIN WITH MINERALS) tablet Take 1 tablet by mouth daily.    . Omega-3 Fatty Acids (FISH OIL) 1000 MG CAPS Take 1,000 mg by mouth daily.    [name]. diltiazem (CARDIZEM CD) 180 MG 24 hr capsule Take 1 capsule (180 mg total) by mouth daily. 30 capsule 6   No current facility-administered medications for this visit.     Allergies:   Patient has no known allergies.   Social History:  The patient  reports that he quit smoking about 5 years ago. His smoking use included cigarettes. He started smoking about 20 years ago. He has a 0.60 pack-year smoking history. He has never used smokeless tobacco. He reports current alcohol use of about 6.0 - 8.0 standard drinks of alcohol per week. He reports that he does not use drugs.   Family History:  The patient's family history includes Hypertension in an other family member.    ROS:  Please see the history of present illness.   Otherwise, review of systems is positive for none.   All other systems are reviewed and negative.    PHYSICAL EXAM: VS:  BP 116/78  Pulse 75   Ht 6' (1.829 m)   Wt 186 lb (84.4 kg)   SpO2 99%   BMI 25.23 kg/m  , BMI Body mass index is 25.23 kg/m. GEN: Well nourished, well developed, in no acute distress  HEENT: normal  Neck: no JVD, carotid bruits, or masses Cardiac: RRR; no murmurs, rubs, or gallops,no edema  Respiratory:  clear to auscultation bilaterally, normal work of breathing GI: soft, nontender, nondistended, + BS MS: no deformity or atrophy  Skin: warm and dry Neuro:  Strength and sensation are intact Psych: euthymic mood, full affect  EKG:  EKG is ordered today. Personal review of  the ekg ordered shows sinus rhythm, frequent PVCs  Recent Labs: 07/19/2018: Hemoglobin 16.9; Platelets 309    Lipid Panel  No results found for: CHOL, TRIG, HDL, CHOLHDL, VLDL, LDLCALC, LDLDIRECT   Wt Readings from Last 3 Encounters:  10/30/18 186 lb (84.4 kg)  08/17/18 189 lb (85.7 kg)  08/16/18 192 lb (87.1 kg)      Other studies Reviewed: Additional studies/ records that were reviewed today include: TTE 09/05/18  Review of the above records today demonstrates:   1. The left ventricle has normal systolic function, with an ejection fraction of 55-60%. The cavity size was mildly dilated. Left ventricular diastolic parameters were normal. No evidence of left ventricular regional wall motion abnormalities.  2. The right ventricle has normal systolic function. The cavity was normal. There is no increase in right ventricular wall thickness.  3. The aortic valve is tricuspid.  4. The aortic root is normal in size and structure.    ASSESSMENT AND PLAN:  1.  Paroxysmal atrial fibrillation: Coagulated due to his low stroke risk.  Echo is without major abnormality.  He was diagnosed in 2016 and had a normal TSH at that time.  He is having minimal episodes of atrial fibrillation and has felt well overall.  He is having some fatigue.  We Sircharles Holzheimer stop metoprolol and increase diltiazem.  This patients CHA2DS2-VASc Score and unadjusted Ischemic Stroke Rate (% per year) is equal to 0.2 % stroke rate/year from a score of 0  Above score calculated as 1 point each if present [CHF, HTN, DM, Vascular=MI/PAD/Aortic Plaque, Age if 65-74, or Male] Above score calculated as 2 points each if present [Age > 75, or Stroke/TIA/TE]  PVCs: Noted to have frequent couplets on ECG.  Echo with a normal ejection fraction.  Currently on metoprolol.  We Mekhi Sonn get a 3-day monitor to get an estimate of the PVC burden.  3.  Alcohol abuse: Cessation encouraged.  Drinks 6-8 beers during the week with more on the  weekends.  Current medicines are reviewed at length with the patient today.   The patient does not have concerns regarding his medicines.  The following changes were made today: Stop metoprolol, increase diltiazem  Labs/ tests ordered today include:  Orders Placed This Encounter  Procedures  . LONG TERM MONITOR (3-14 DAYS)  . EKG 12-Lead     Disposition:   FU with Yahmir Sokolov 3 months  Signed, Anberlin Diez Meredith Leeds, MD  10/30/2018 11:32 AM     [facility] [address] [place] [address] [phone] (office) [phone] (fax)

## 2018-10-30 NOTE — Patient Instructions (Addendum)
Medication Instructions:  Your physician has recommended you make the following change in your medication:  1. STOP Metoprolol Tartrate (Lopressor) 2. INCREASE Diltiazem to 180 mg once daily  * If you need a refill on your cardiac medications before your next appointment, please call your pharmacy.   Labwork: None ordered If you have labs (blood work) drawn today and your tests are completely normal, you will receive your results only by:  MyChart Message (if you have MyChart) OR  A paper copy in the mail If you have any lab test that is abnormal or we need to change your treatment, we will call you to review the results.  Testing/Procedures: Your physician has recommended that you wear a 3 day holter monitor. Holter monitors are medical devices that record the hearts electrical activity. Doctors most often use these monitors to diagnose arrhythmias. Arrhythmias are problems with the speed or rhythm of the heartbeat. The monitor is a small, portable device. You can wear one while you do your normal daily activities. This is usually used to diagnose what is causing palpitations/syncope (passing out).  Follow-Up: To be determined after monitor completed/reviewed.  Thank you for choosing [facility]!!   Dory HornSherri Beverley Sherrard, RN [phone]  Any Other Special Instructions Will Be Listed Below (If Applicable).  Ambulatory Cardiac Monitoring An ambulatory cardiac monitor is a small recording device that is used to detect abnormal heart rhythms (arrhythmias). Most monitors are connected by wires to flat, sticky disks (electrodes) that are then attached to your chest. You may need to wear a monitor if you have had symptoms such as:  Fast heartbeats (palpitations).  Dizziness.  Fainting or light-headedness.  Unexplained weakness.  Shortness of breath. There are several types of monitors. Some common monitors include:  Holter monitor. This records your heart rhythm continuously,  usually for 24-48 hours.  Event (episodic) monitor. This monitor has a symptoms button, and when pushed, it will begin recording. You need to activate this monitor to record when you have a heart-related symptom.  Automatic detection monitor. This monitor will begin recording when it detects an abnormal heartbeat. What are the risks? Generally, these devices are safe to use. However, it is possible that the skin under the electrodes will become irritated. How to prepare for monitoring Your health care provider will prepare your chest for the electrode placement and show you how to use the monitor.  Do not apply lotions to your chest before monitoring.  Follow directions on how to care for the monitor, and how to return the monitor when the testing period is complete. How to use your cardiac monitor  Follow directions about how long to wear the monitor, and if you can take the monitor off in order to shower or bathe. ? Do not let the monitor get wet. ? Do not bathe, swim, or use a hot tub while wearing the monitor.  Keep your skin clean. Do not put body lotion or moisturizer on your chest.  Change the electrodes as told by your health care provider, or any time they stop sticking to your skin. You may need to use medical tape to keep them on.  Try to put the electrodes in slightly different places on your chest to help prevent skin irritation. Follow directions from your health care provider about where to place the electrodes.  Make sure the monitor is safely clipped to your clothing or in a location close to your body as recommended by your health care provider.  If  your monitor has a symptoms button, press the button to mark an event as soon as you feel a heart-related symptom, such as: ? Dizziness. ? Weakness. ? Light-headedness. ? Palpitations. ? Thumping or pounding in your chest. ? Shortness of breath. ? Unexplained weakness.  Keep a diary of your activities, such as  walking, doing chores, and taking medicine. It is very important to note what you were doing when you pushed the button to record your symptoms. This will help your health care provider determine what might be contributing to your symptoms.  Send the recorded information as recommended by your health care provider. It may take some time for your health care provider to process the results.  Change the batteries as told by your health care provider.  Keep electronic devices away from your monitor. These include: ? Tablets. ? MP3 players. ? Cell phones.  While wearing your monitor you should avoid: ? Electric blankets. ? Armed forces operational officer. ? Electric toothbrushes. ? Microwave ovens. ? Magnets. ? Metal detectors. Get help right away if:  You have chest pain.  You have shortness of breath or extreme difficulty breathing.  You develop a very fast heartbeat that does not get better.  You develop dizziness that does not go away.  You faint or constantly feel like you are about to faint. Summary  An ambulatory cardiac monitor is a small recording device that is used to detect abnormal heart rhythms (arrhythmias).  Make sure you understand how to send the information from the monitor to your health care provider.  It is important to press the button on the monitor when you have any heart-related symptoms.  Keep a diary of your activities, such as walking, doing chores, and taking medicine. It is very important to note what you were doing when you pushed the button to record your symptoms. This will help your health care provider learn what might be causing your symptoms. This information is not intended to replace advice given to you by your health care provider. Make sure you discuss any questions you have with your health care provider. Document Released: 11/17/2007 Document Revised: 01/20/2017 Document Reviewed: 01/23/2016 Elsevier Patient Education  2020 [employer].

## 2018-11-05 ENCOUNTER — Telehealth: Payer: Self-pay | Admitting: *Deleted

## 2018-11-05 NOTE — Telephone Encounter (Signed)
3 day ZIO XT long term holter monitor to be mailed to the patients home.  Instructions reviewed briefly as they are included in the monitor kit. 

## 2018-11-12 ENCOUNTER — Ambulatory Visit (INDEPENDENT_AMBULATORY_CARE_PROVIDER_SITE_OTHER): Payer: BC Managed Care – PPO

## 2018-11-12 DIAGNOSIS — I493 Ventricular premature depolarization: Secondary | ICD-10-CM | POA: Diagnosis not present

## 2018-12-17 ENCOUNTER — Telehealth: Payer: Self-pay | Admitting: *Deleted

## 2018-12-17 MED ORDER — METOPROLOL SUCCINATE ER 50 MG PO TB24: 50.0000 mg | ORAL_TABLET | Freq: Every day | ORAL | 3 refills | Status: DC

## 2018-12-17 NOTE — Telephone Encounter (Signed)
Pt has been notified of device check results and medication changes per Dr. Curt Bears. Pt is agreeable to d/c Diltiazem and start Toprol XL 50 mg at [facility]. I will send in new Rx to [place]. Pt verbalized instructions by phone with verbal understanding. Pt thanked me for the call. Patient notified of result.  Please refer to phone note from today for complete details.   Julaine Hua, [facility] 12/17/2018 9:23 AM

## 2018-12-17 NOTE — Telephone Encounter (Signed)
-----   Message from [name], MD sent at 12/07/2018  4:21 PM EDT ----- High burden of NSVT. No AF noted. Switch from diltiazem to toprol XL to take at night 50 mg

## 2019-04-08 DIAGNOSIS — S233XXA Sprain of ligaments of thoracic spine, initial encounter: Secondary | ICD-10-CM | POA: Diagnosis not present

## 2019-04-08 DIAGNOSIS — M531 Cervicobrachial syndrome: Secondary | ICD-10-CM | POA: Diagnosis not present

## 2019-04-08 DIAGNOSIS — S134XXA Sprain of ligaments of cervical spine, initial encounter: Secondary | ICD-10-CM | POA: Diagnosis not present

## 2019-04-09 DIAGNOSIS — S134XXA Sprain of ligaments of cervical spine, initial encounter: Secondary | ICD-10-CM | POA: Diagnosis not present

## 2019-04-09 DIAGNOSIS — S233XXA Sprain of ligaments of thoracic spine, initial encounter: Secondary | ICD-10-CM | POA: Diagnosis not present

## 2019-04-09 DIAGNOSIS — M531 Cervicobrachial syndrome: Secondary | ICD-10-CM | POA: Diagnosis not present

## 2019-04-10 DIAGNOSIS — M531 Cervicobrachial syndrome: Secondary | ICD-10-CM | POA: Diagnosis not present

## 2019-04-10 DIAGNOSIS — S134XXA Sprain of ligaments of cervical spine, initial encounter: Secondary | ICD-10-CM | POA: Diagnosis not present

## 2019-04-10 DIAGNOSIS — S233XXA Sprain of ligaments of thoracic spine, initial encounter: Secondary | ICD-10-CM | POA: Diagnosis not present

## 2019-04-15 DIAGNOSIS — S134XXA Sprain of ligaments of cervical spine, initial encounter: Secondary | ICD-10-CM | POA: Diagnosis not present

## 2019-04-15 DIAGNOSIS — S233XXA Sprain of ligaments of thoracic spine, initial encounter: Secondary | ICD-10-CM | POA: Diagnosis not present

## 2019-04-15 DIAGNOSIS — M531 Cervicobrachial syndrome: Secondary | ICD-10-CM | POA: Diagnosis not present

## 2019-05-09 DIAGNOSIS — S134XXA Sprain of ligaments of cervical spine, initial encounter: Secondary | ICD-10-CM | POA: Diagnosis not present

## 2019-05-09 DIAGNOSIS — S233XXA Sprain of ligaments of thoracic spine, initial encounter: Secondary | ICD-10-CM | POA: Diagnosis not present

## 2019-05-09 DIAGNOSIS — M531 Cervicobrachial syndrome: Secondary | ICD-10-CM | POA: Diagnosis not present

## 2019-08-05 DIAGNOSIS — S233XXA Sprain of ligaments of thoracic spine, initial encounter: Secondary | ICD-10-CM | POA: Diagnosis not present

## 2019-08-05 DIAGNOSIS — S134XXA Sprain of ligaments of cervical spine, initial encounter: Secondary | ICD-10-CM | POA: Diagnosis not present

## 2019-08-05 DIAGNOSIS — S335XXA Sprain of ligaments of lumbar spine, initial encounter: Secondary | ICD-10-CM | POA: Diagnosis not present

## 2019-10-09 DIAGNOSIS — S335XXA Sprain of ligaments of lumbar spine, initial encounter: Secondary | ICD-10-CM | POA: Diagnosis not present

## 2019-10-09 DIAGNOSIS — S134XXA Sprain of ligaments of cervical spine, initial encounter: Secondary | ICD-10-CM | POA: Diagnosis not present

## 2019-10-09 DIAGNOSIS — S233XXA Sprain of ligaments of thoracic spine, initial encounter: Secondary | ICD-10-CM | POA: Diagnosis not present

## 2019-10-14 DIAGNOSIS — S233XXA Sprain of ligaments of thoracic spine, initial encounter: Secondary | ICD-10-CM | POA: Diagnosis not present

## 2019-10-14 DIAGNOSIS — S134XXA Sprain of ligaments of cervical spine, initial encounter: Secondary | ICD-10-CM | POA: Diagnosis not present

## 2019-10-14 DIAGNOSIS — S335XXA Sprain of ligaments of lumbar spine, initial encounter: Secondary | ICD-10-CM | POA: Diagnosis not present

## 2020-01-08 DIAGNOSIS — S233XXA Sprain of ligaments of thoracic spine, initial encounter: Secondary | ICD-10-CM | POA: Diagnosis not present

## 2020-01-08 DIAGNOSIS — S134XXA Sprain of ligaments of cervical spine, initial encounter: Secondary | ICD-10-CM | POA: Diagnosis not present

## 2020-01-08 DIAGNOSIS — S335XXA Sprain of ligaments of lumbar spine, initial encounter: Secondary | ICD-10-CM | POA: Diagnosis not present

## 2020-02-10 DIAGNOSIS — Z0000 Encounter for general adult medical examination without abnormal findings: Secondary | ICD-10-CM | POA: Diagnosis not present

## 2020-02-10 DIAGNOSIS — I10 Essential (primary) hypertension: Secondary | ICD-10-CM | POA: Diagnosis not present

## 2020-02-10 DIAGNOSIS — E782 Mixed hyperlipidemia: Secondary | ICD-10-CM | POA: Diagnosis not present

## 2020-02-10 DIAGNOSIS — I480 Paroxysmal atrial fibrillation: Secondary | ICD-10-CM | POA: Diagnosis not present

## 2020-02-12 ENCOUNTER — Other Ambulatory Visit: Payer: Self-pay

## 2020-02-12 ENCOUNTER — Ambulatory Visit: Payer: BC Managed Care – PPO | Admitting: Student

## 2020-02-12 ENCOUNTER — Encounter: Payer: Self-pay | Admitting: Student

## 2020-02-12 VITALS — BP 120/78 | HR 157 | Ht 72.0 in | Wt 194.8 lb

## 2020-02-12 DIAGNOSIS — I480 Paroxysmal atrial fibrillation: Secondary | ICD-10-CM

## 2020-02-12 DIAGNOSIS — I493 Ventricular premature depolarization: Secondary | ICD-10-CM | POA: Diagnosis not present

## 2020-02-12 LAB — CBC WITH DIFFERENTIAL/PLATELET
Basophils Absolute: 0.1 10*3/uL (ref 0.0–0.2)
Basos: 1 %
EOS (ABSOLUTE): 0.3 10*3/uL (ref 0.0–0.4)
Eos: 3 %
Hematocrit: 53.4 % — ABNORMAL HIGH (ref 37.5–51.0)
Hemoglobin: 18.4 g/dL — ABNORMAL HIGH (ref 13.0–17.7)
Lymphocytes Absolute: 3.3 10*3/uL — ABNORMAL HIGH (ref 0.7–3.1)
Lymphs: 33 %
MCH: 32.5 pg (ref 26.6–33.0)
MCHC: 34.5 g/dL (ref 31.5–35.7)
MCV: 94 fL (ref 79–97)
Monocytes Absolute: 1.1 10*3/uL — ABNORMAL HIGH (ref 0.1–0.9)
Monocytes: 11 %
Neutrophils Absolute: 5.2 10*3/uL (ref 1.4–7.0)
Neutrophils: 52 %
Platelets: 325 10*3/uL (ref 150–450)
RBC: 5.66 x10E6/uL (ref 4.14–5.80)
RDW: 13.6 % (ref 11.6–15.4)
WBC: 9.9 10*3/uL (ref 3.4–10.8)

## 2020-02-12 LAB — BASIC METABOLIC PANEL
BUN/Creatinine Ratio: 17 (ref 9–20)
BUN: 17 mg/dL (ref 6–20)
CO2: 25 mmol/L (ref 20–29)
Calcium: 10.2 mg/dL (ref 8.7–10.2)
Chloride: 102 mmol/L (ref 96–106)
Creatinine, Ser: 0.99 mg/dL (ref 0.76–1.27)
GFR calc Af Amer: 114 mL/min/{1.73_m2} (ref >59)
GFR calc non Af Amer: 99 mL/min/{1.73_m2} (ref >59)
Glucose: 95 mg/dL (ref 65–99)
Potassium: 4.7 mmol/L (ref 3.5–5.2)
Sodium: 139 mmol/L (ref 134–144)

## 2020-02-12 MED ORDER — METOPROLOL SUCCINATE ER 100 MG PO TB24: 100.0000 mg | ORAL_TABLET | Freq: Every day | ORAL | 3 refills | Status: DC

## 2020-02-12 MED ORDER — APIXABAN 5 MG PO TABS: 5.0000 mg | ORAL_TABLET | Freq: Two times a day (BID) | ORAL | 11 refills | Status: DC

## 2020-02-12 NOTE — H&P (View-Only) (Signed)
PCP:  Patient, [name] Primary Cardiologist: Prentice Docker, MD (Inactive) Electrophysiologist: Will [name], MD   Patrick Chase is a 34 y.o. male seen today for Will [name], MD for acute visit due to palpitations.  Since last being seen in our clinic the patient reports doing overall well. He has had intermittent paroxysmal of AF. He feels as if he has been in AF currently for the past 2-3 weeks. Not on OAC chronically with historically low CHA2DS2VASC of 1. He has intermittent tachy palpitations but otherwise overall dose well. He has had significantly less energy. He has mild SOB with more than moderate exertion.  he denies chest pain,  PND, orthopnea, nausea, vomiting, dizziness, syncope, edema, weight gain, or early satiety.  Past Medical History:  Diagnosis Date  . Atrial fibrillation (HCC)   . Hypertension    Past Surgical History:  Procedure Laterality Date  . BIOPSY  08/02/2018   Procedure: BIOPSY;  Surgeon: Malissa Hippo, MD;  [facility];  Service: Endoscopy;;  . COLONOSCOPY N/A 08/02/2018   Procedure: COLONOSCOPY;  Surgeon: Malissa Hippo, MD;  [facility];  Service: Endoscopy;  Laterality: N/A;  1230pm    Current Outpatient Medications  Medication Sig Dispense Refill  . diltiazem (CARDIZEM CD) 180 MG 24 hr capsule Take 1 capsule (180 mg total) by mouth daily. 30 capsule 6  . metoprolol succinate (TOPROL XL) 50 MG 24 hr tablet Take 1 tablet (50 mg total) by mouth at bedtime. Take with or immediately following a meal. 90 tablet 3  . Multiple Vitamins-Minerals (MULTIVITAMIN WITH MINERALS) tablet Take 1 tablet by mouth daily.    . Omega-3 Fatty Acids (FISH OIL) 1000 MG CAPS Take 1,000 mg by mouth daily.     No current facility-administered medications for this visit.    No Known Allergies  Social History   Socioeconomic History  . Marital status: Married    Spouse name: Not on file  . Number of children: Not on file  .  Years of education: Not on file  . Highest education level: Not on file  Occupational History  . Not on file  Tobacco Use  . Smoking status: Current Some Day Smoker    Packs/day: 0.05    Years: 12.00    Pack years: 0.60    Types: Cigarettes    Start date: 02/21/1998    Last attempt to quit: 07/15/2013    Years since quitting: 6.5  . Smokeless tobacco: Never Used  Substance and Sexual Activity  . Alcohol use: Yes    Alcohol/week: 6.0 - 8.0 standard drinks    Types: 6 - 8 Cans of beer per week  . Drug use: No  . Sexual activity: Not on file  Other Topics Concern  . Not on file  Social History Narrative  . Not on file   Social Determinants of Health   Financial Resource Strain: Not on file  Food Insecurity: Not on file  Transportation Needs: Not on file  Physical Activity: Not on file  Stress: Not on file  Social Connections: Not on file  Intimate Partner Violence: Not on file   Review of systems complete and found to be negative unless listed in HPI.    Physical Exam: Vitals:   02/12/20 1208  BP: 120/78  Pulse: (!) 157  SpO2: 97%  Weight: 194 lb 12.8 oz (88.4 kg)  Height: 6' (1.829 m)    GEN- The patient is well appearing,  alert and oriented x 3 today.   HEENT: normocephalic, atraumatic; sclera clear, conjunctiva pink; hearing intact; oropharynx clear; neck supple, no JVP Lymph- no cervical lymphadenopathy Lungs- Clear to ausculation bilaterally, normal work of breathing.  No wheezes, rales, rhonchi Heart- Tachy but irregular rate and rhythm, no murmurs, rubs or gallops, PMI not laterally displaced GI- soft, non-tender, non-distended, bowel sounds present, no hepatosplenomegaly Extremities- no clubbing, cyanosis, or edema; DP/PT/radial pulses 2+ bilaterally MS- no significant deformity or atrophy Skin- warm and dry, no rash or lesion Psych- euthymic mood, full affect Neuro- strength and sensation are intact  EKG is ordered. Personal review of EKG from today shows  AF vs AFL 157 bpm  Additional studies reviewed include: Previous EP and AF clinic notes. Echo 08/2018 LVEF 55-60%  Assessment and Plan:  1. Persistent atrial fibrillation with RVR Will start Eliquis 5 mg BID with anticipation of TEE/DCC.  CHA2DS2VASC of 1, so will need to further address [facility] plan once definitive therapy decided upon. Pt interested in ablation   Continue diltiazem 180 mg daily Increase toprol to 100 mg qhs.  Labs today for TEE/DCC  2. HTN Continue current medications.   3. ETOH abuse 6-8 beers a day.  Encouraged cessation or limit to < 2 drinks a day.   With now persistent AF, will plan TEE/DCC. F/u with Dr. Elberta Fortis after to discuss ablation. Reviewed alarm symptoms/ER for chest pain, syncope, or SOB that does not resolve.   Graciella Freer, PA-C  02/12/20 12:23 PM

## 2020-02-12 NOTE — Progress Notes (Signed)
 PCP:  Patient, [name] Primary Cardiologist: Suresh Koneswaran, MD (Inactive) Electrophysiologist: Will Martin Camnitz, MD   Patrick Chase is a 34 y.o. male seen today for Will Martin Camnitz, MD for acute visit due to palpitations.  Since last being seen in our clinic the patient reports doing overall well. He has had intermittent paroxysmal of AF. He feels as if he has been in AF currently for the past 2-3 weeks. Not on OAC chronically with historically low CHA2DS2VASC of 1. He has intermittent tachy palpitations but otherwise overall dose well. He has had significantly less energy. He has mild SOB with more than moderate exertion.  he denies chest pain,  PND, orthopnea, nausea, vomiting, dizziness, syncope, edema, weight gain, or early satiety.  Past Medical History:  Diagnosis Date  . Atrial fibrillation (HCC)   . Hypertension    Past Surgical History:  Procedure Laterality Date  . BIOPSY  08/02/2018   Procedure: BIOPSY;  Surgeon: Rehman, Najeeb U, MD;  [facility];  Service: Endoscopy;;  . COLONOSCOPY N/A 08/02/2018   Procedure: COLONOSCOPY;  Surgeon: Rehman, Najeeb U, MD;  [facility];  Service: Endoscopy;  Laterality: N/A;  1230pm    Current Outpatient Medications  Medication Sig Dispense Refill  . diltiazem (CARDIZEM CD) 180 MG 24 hr capsule Take 1 capsule (180 mg total) by mouth daily. 30 capsule 6  . metoprolol succinate (TOPROL XL) 50 MG 24 hr tablet Take 1 tablet (50 mg total) by mouth at bedtime. Take with or immediately following a meal. 90 tablet 3  . Multiple Vitamins-Minerals (MULTIVITAMIN WITH MINERALS) tablet Take 1 tablet by mouth daily.    . Omega-3 Fatty Acids (FISH OIL) 1000 MG CAPS Take 1,000 mg by mouth daily.     No current facility-administered medications for this visit.    No Known Allergies  Social History   Socioeconomic History  . Marital status: Married    Spouse name: Not on file  . Number of children: Not on file  .  Years of education: Not on file  . Highest education level: Not on file  Occupational History  . Not on file  Tobacco Use  . Smoking status: Current Some Day Smoker    Packs/day: 0.05    Years: 12.00    Pack years: 0.60    Types: Cigarettes    Start date: 02/21/1998    Last attempt to quit: 07/15/2013    Years since quitting: 6.5  . Smokeless tobacco: Never Used  Substance and Sexual Activity  . Alcohol use: Yes    Alcohol/week: 6.0 - 8.0 standard drinks    Types: 6 - 8 Cans of beer per week  . Drug use: No  . Sexual activity: Not on file  Other Topics Concern  . Not on file  Social History Narrative  . Not on file   Social Determinants of Health   Financial Resource Strain: Not on file  Food Insecurity: Not on file  Transportation Needs: Not on file  Physical Activity: Not on file  Stress: Not on file  Social Connections: Not on file  Intimate Partner Violence: Not on file   Review of systems complete and found to be negative unless listed in HPI.    Physical Exam: Vitals:   02/12/20 1208  BP: 120/78  Pulse: (!) 157  SpO2: 97%  Weight: 194 lb 12.8 oz (88.4 kg)  Height: 6' (1.829 m)    GEN- The patient is well appearing,   alert and oriented x 3 today.   HEENT: normocephalic, atraumatic; sclera clear, conjunctiva pink; hearing intact; oropharynx clear; neck supple, no JVP Lymph- no cervical lymphadenopathy Lungs- Clear to ausculation bilaterally, normal work of breathing.  No wheezes, rales, rhonchi Heart- Tachy but irregular rate and rhythm, no murmurs, rubs or gallops, PMI not laterally displaced GI- soft, non-tender, non-distended, bowel sounds present, no hepatosplenomegaly Extremities- no clubbing, cyanosis, or edema; DP/PT/radial pulses 2+ bilaterally MS- no significant deformity or atrophy Skin- warm and dry, no rash or lesion Psych- euthymic mood, full affect Neuro- strength and sensation are intact  EKG is ordered. Personal review of EKG from today shows  AF vs AFL 157 bpm  Additional studies reviewed include: Previous EP and AF clinic notes. Echo 08/2018 LVEF 55-60%  Assessment and Plan:  1. Persistent atrial fibrillation with RVR Will start Eliquis 5 mg BID with anticipation of TEE/DCC.  CHA2DS2VASC of 1, so will need to further address [facility] plan once definitive therapy decided upon. Pt interested in ablation   Continue diltiazem 180 mg daily Increase toprol to 100 mg qhs.  Labs today for TEE/DCC  2. HTN Continue current medications.   3. ETOH abuse 6-8 beers a day.  Encouraged cessation or limit to < 2 drinks a day.   With now persistent AF, will plan TEE/DCC. F/u with Dr. Elberta Fortis after to discuss ablation. Reviewed alarm symptoms/ER for chest pain, syncope, or SOB that does not resolve.   Graciella Freer, PA-C  02/12/20 12:23 PM

## 2020-02-12 NOTE — Patient Instructions (Addendum)
Medication Instructions:  Your physician has recommended you make the following change in your medication:  -- START Eliquis 5 mg - Take 1 tablet (5 mg) by mouth twice daily -- RX SENT -- INCREASE Metoprolol Succinate (Toprol) to 100 mg - Take 1 tablet by mouth daily at bedtime - RX SENT *If you need a refill on your cardiac medications before your next appointment, please call your pharmacy*  Lab Work: Your physician has recommended that you have lab work today: BMET and CBC  If you have labs (blood work) drawn today and your tests are completely normal, you will receive your results only by: [name] MyChart Message (if you have MyChart) OR . A paper copy in the mail If you have any lab test that is abnormal or we need to change your treatment, we will call you to review the results.  Testing/Procedures: You will need to have a pre procedure Covid Screening Test on Monday 02/17/20 at 1:40 pm --This is a Drive Up Visit at [address] [place], [place] [address].  --Someone will direct you to the appropriate testing line. Stay in your car and someone will be with you shortly.  Your physician has requested that you have a TEE/Cardioversion. During a TEE, sound waves are used to create images of your heart. It provides your doctor with information about the size and shape of your heart and how well your heart's chambers and valves are working. In this test, a transducer is attached to the end of a flexible tube that is guided down you throat and into your esophagus (the tube leading from your mouth to your stomach) to get a more detailed image of your heart. Once the TEE has determined that a blood clot is not present, the cardioversion begins. Electrical Cardioversion uses a jolt of electricity to your heart either through paddles or wired patches attached to your chest. This is a controlled, usually prescheduled, procedure. This procedure is done at the hospital and you are not awake during the  procedure. You usually go home the day of the procedure. Please see the instruction sheet given to you today for more information.  Follow-Up: At [facility], you and your health needs are our priority.  As part of our continuing mission to provide you with exceptional heart care, we have created designated Provider Care Teams.  These Care Teams include your primary Cardiologist (physician) and Advanced Practice Providers (APPs -  Physician Assistants and Nurse Practitioners) who all work together to provide you with the care you need, when you need it.  We recommend signing up for the patient portal called "MyChart".  Sign up information is provided on this After Visit Summary.  MyChart is used to connect with patients for Virtual Visits (Telemedicine).  Patients are able to view lab/test results, encounter notes, upcoming appointments, etc.  Non-urgent messages can be sent to your provider as well.   To learn more about what you can do with MyChart, go to [email or web address].    Your next appointment:   Your physician recommends that you schedule a follow-up appointment in: 3-4 WEEKS with Dr. Elberta Fortis to discuss Ablation -- 03/03/20 at 9:00 am  The format for your next appointment:   In Person with Loman Brooklyn, MD     --------------------------------------CARDIOVERSION INSTRUCTIONS--------------------------------------   You are scheduled for a cardioversion on Wednesday 02/17/20 at 8:00 am with Dr. Anne Fu. Please go to [facility] 2nd Floor Short Stay at 6:30 am.  Enter through the [facility]  Science writer A -- [address] [place] Do not have any food or drink after midnight on Tuesday 02/18/20.  You may take your medicines with a sip of water on the day of your procedure.  You will need someone to drive you home following your procedure.   Call the [facility] office at [phone] if you have any questions, problems or concerns.      Electrical Cardioversion Electrical cardioversion is the delivery of a jolt of electricity to change the rhythm of the heart. Sticky patches or metal paddles are placed on the chest to deliver the electricity from a device. This is done to restore a normal rhythm. A rhythm that is too fast or not regular keeps the heart from pumping well. Electrical cardioversion is done in an emergency if:   There is low or no blood pressure as a result of the heart rhythm.   Normal rhythm must be restored as fast as possible to protect the brain and heart from further damage.   It may save a life. Cardioversion may be done for heart rhythms that are not immediately life threatening, such as atrial fibrillation or flutter, in which:   The heart is beating too fast or is not regular.   Medicine to change the rhythm has not worked.   It is safe to wait in order to allow time for preparation.  Symptoms of the abnormal rhythm are bothersome.  The risk of stroke and other serious problems can be reduced.  LET [facility] CARE PROVIDER KNOW ABOUT:   Any allergies you have.  All medicines you are taking, including vitamins, herbs, eye drops, creams, and over-the-counter medicines.  Previous problems you or members of your family have had with the use of anesthetics.   Any blood disorders you have.   Previous surgeries you have had.   Medical conditions you have.   RISKS AND COMPLICATIONS  Generally, this is a safe procedure. However, problems can occur and include:   Breathing problems related to the anesthetic used.  A blood clot that breaks free and travels to other parts of your body. This could cause a stroke or other problems. The risk of this is lowered by use of blood-thinning medicine (anticoagulant) prior to the procedure.  Cardiac arrest (rare).   BEFORE THE PROCEDURE   You may have tests to detect blood clots in your heart and to evaluate heart function.  You may  start taking anticoagulants so your blood does not clot as easily.   Medicines may be given to help stabilize your heart rate and rhythm.   PROCEDURE  You will be given medicine through an IV tube to reduce discomfort and make you sleepy (sedative).   An electrical shock will be delivered.   AFTER THE PROCEDURE Your heart rhythm will be watched to make sure it does not change. You will need someone to drive you home

## 2020-02-17 ENCOUNTER — Other Ambulatory Visit (HOSPITAL_COMMUNITY)
Admission: RE | Admit: 2020-02-17 | Discharge: 2020-02-17 | Disposition: A | Discharge status: Home / Self Care | Payer: BC Managed Care – PPO | Source: Ambulatory Visit | Attending: Cardiology | Admitting: Cardiology

## 2020-02-17 DIAGNOSIS — I10 Essential (primary) hypertension: Secondary | ICD-10-CM | POA: Diagnosis not present

## 2020-02-17 DIAGNOSIS — Z01812 Encounter for preprocedural laboratory examination: Secondary | ICD-10-CM | POA: Insufficient documentation

## 2020-02-17 DIAGNOSIS — F1010 Alcohol abuse, uncomplicated: Secondary | ICD-10-CM | POA: Diagnosis not present

## 2020-02-17 DIAGNOSIS — Z20822 Contact with and (suspected) exposure to covid-19: Secondary | ICD-10-CM | POA: Insufficient documentation

## 2020-02-17 DIAGNOSIS — Z79899 Other long term (current) drug therapy: Secondary | ICD-10-CM | POA: Diagnosis not present

## 2020-02-17 DIAGNOSIS — I4819 Other persistent atrial fibrillation: Secondary | ICD-10-CM | POA: Diagnosis not present

## 2020-02-17 DIAGNOSIS — I4892 Unspecified atrial flutter: Secondary | ICD-10-CM | POA: Diagnosis not present

## 2020-02-17 DIAGNOSIS — I429 Cardiomyopathy, unspecified: Secondary | ICD-10-CM | POA: Diagnosis not present

## 2020-02-17 DIAGNOSIS — F17210 Nicotine dependence, cigarettes, uncomplicated: Secondary | ICD-10-CM | POA: Diagnosis not present

## 2020-02-18 LAB — SARS CORONAVIRUS 2 (TAT 6-24 HRS): SARS Coronavirus 2: NEGATIVE

## 2020-02-18 NOTE — Anesthesia Preprocedure Evaluation (Addendum)
Anesthesia Evaluation  Patient identified by MRN, date of birth, ID band Patient awake    Reviewed: Allergy & Precautions, NPO status , Patient's Chart, lab work & pertinent test results  Airway Mallampati: II  TM Distance: >3 FB Neck ROM: Full    Dental no notable dental hx. (+) Teeth Intact, Dental Advisory Given   Pulmonary Current Smoker,    Pulmonary exam normal breath sounds clear to auscultation       Cardiovascular Exercise Tolerance: Good hypertension, Pt. on home beta blockers and Pt. on medications Normal cardiovascular exam+ dysrhythmias Atrial Fibrillation  Rhythm:Regular Rate:Normal     Neuro/Psych negative neurological ROS  negative psych ROS   GI/Hepatic negative GI ROS, (+)     substance abuse  alcohol use,   Endo/Other  negative endocrine ROS  Renal/GU negative Renal ROS     Musculoskeletal negative musculoskeletal ROS (+)   Abdominal   Peds  Hematology   Anesthesia Other Findings   Reproductive/Obstetrics                            Anesthesia Physical Anesthesia Plan  ASA: II  Anesthesia Plan: MAC   Post-op Pain Management:    Induction:   PONV Risk Score and Plan: 2 and Treatment may vary due to age or medical condition and Ondansetron  Airway Management Planned: Nasal Cannula and Natural Airway  Additional Equipment: None  Intra-op Plan:   Post-operative Plan:   Informed Consent: I have reviewed the patients History and Physical, chart, labs and discussed the procedure including the risks, benefits and alternatives for the proposed anesthesia with the patient or authorized representative who has indicated his/her understanding and acceptance.     Dental advisory given  Plan Discussed with: CRNA and Anesthesiologist  Anesthesia Plan Comments:        Anesthesia Quick Evaluation

## 2020-02-19 ENCOUNTER — Other Ambulatory Visit: Payer: Self-pay

## 2020-02-19 ENCOUNTER — Ambulatory Visit (HOSPITAL_BASED_OUTPATIENT_CLINIC_OR_DEPARTMENT_OTHER)
Admission: RE | Admit: 2020-02-19 | Discharge: 2020-02-19 | Disposition: A | Discharge status: Home / Self Care | Payer: BC Managed Care – PPO | Source: Ambulatory Visit | Attending: Student | Admitting: Student

## 2020-02-19 ENCOUNTER — Telehealth: Payer: Self-pay

## 2020-02-19 ENCOUNTER — Encounter (HOSPITAL_COMMUNITY): Payer: Self-pay | Admitting: Cardiology

## 2020-02-19 ENCOUNTER — Ambulatory Visit (HOSPITAL_COMMUNITY): Payer: BC Managed Care – PPO | Admitting: Anesthesiology

## 2020-02-19 ENCOUNTER — Ambulatory Visit (HOSPITAL_COMMUNITY)
Admission: RE | Admit: 2020-02-19 | Discharge: 2020-02-19 | Disposition: A | Discharge status: Home / Self Care | Payer: BC Managed Care – PPO | Attending: Cardiology | Admitting: Cardiology

## 2020-02-19 ENCOUNTER — Encounter (HOSPITAL_COMMUNITY)
Admission: RE | Disposition: A | Discharge status: Home / Self Care | Payer: Self-pay | Source: Home / Self Care | Attending: Cardiology

## 2020-02-19 DIAGNOSIS — I340 Nonrheumatic mitral (valve) insufficiency: Secondary | ICD-10-CM | POA: Diagnosis not present

## 2020-02-19 DIAGNOSIS — Z79899 Other long term (current) drug therapy: Secondary | ICD-10-CM | POA: Diagnosis not present

## 2020-02-19 DIAGNOSIS — I4892 Unspecified atrial flutter: Secondary | ICD-10-CM | POA: Insufficient documentation

## 2020-02-19 DIAGNOSIS — I4891 Unspecified atrial fibrillation: Secondary | ICD-10-CM

## 2020-02-19 DIAGNOSIS — I429 Cardiomyopathy, unspecified: Secondary | ICD-10-CM | POA: Diagnosis not present

## 2020-02-19 DIAGNOSIS — F17210 Nicotine dependence, cigarettes, uncomplicated: Secondary | ICD-10-CM | POA: Insufficient documentation

## 2020-02-19 DIAGNOSIS — F1010 Alcohol abuse, uncomplicated: Secondary | ICD-10-CM | POA: Diagnosis not present

## 2020-02-19 DIAGNOSIS — I4819 Other persistent atrial fibrillation: Secondary | ICD-10-CM | POA: Insufficient documentation

## 2020-02-19 DIAGNOSIS — Z20822 Contact with and (suspected) exposure to covid-19: Secondary | ICD-10-CM | POA: Insufficient documentation

## 2020-02-19 DIAGNOSIS — I10 Essential (primary) hypertension: Secondary | ICD-10-CM | POA: Diagnosis not present

## 2020-02-19 DIAGNOSIS — I493 Ventricular premature depolarization: Secondary | ICD-10-CM | POA: Diagnosis not present

## 2020-02-19 HISTORY — PX: CARDIOVERSION: SHX1299

## 2020-02-19 HISTORY — PX: TEE WITHOUT CARDIOVERSION: SHX5443

## 2020-02-19 SURGERY — ECHOCARDIOGRAM, TRANSESOPHAGEAL
Anesthesia: Monitor Anesthesia Care

## 2020-02-19 MED ORDER — DEXMEDETOMIDINE HCL 200 MCG/2ML IV SOLN
INTRAVENOUS | Status: DC | PRN
  Administered 2020-02-19: 8 ug via INTRAVENOUS

## 2020-02-19 MED ORDER — LOSARTAN POTASSIUM 25 MG PO TABS
25.0000 mg | ORAL_TABLET | Freq: Every day | ORAL | 2 refills | Status: DC
Start: 2020-02-19 — End: 2020-09-15

## 2020-02-19 MED ORDER — MIDAZOLAM HCL 2 MG/2ML IJ SOLN
INTRAMUSCULAR | Status: DC | PRN
  Administered 2020-02-19: 2 mg via INTRAVENOUS

## 2020-02-19 MED ORDER — PROPOFOL 10 MG/ML IV BOLUS
INTRAVENOUS | Status: DC | PRN
  Administered 2020-02-19 (×2): 30 mg via INTRAVENOUS

## 2020-02-19 MED ORDER — FENTANYL CITRATE (PF) 250 MCG/5ML IJ SOLN
INTRAMUSCULAR | Status: DC | PRN
  Administered 2020-02-19 (×4): 25 ug via INTRAVENOUS

## 2020-02-19 MED ORDER — APIXABAN 5 MG PO TABS
5.0000 mg | ORAL_TABLET | Freq: Two times a day (BID) | ORAL | Status: DC
  Administered 2020-02-19: 5 mg via ORAL
  Filled 2020-02-19: qty 1

## 2020-02-19 MED ORDER — PROPOFOL 500 MG/50ML IV EMUL
INTRAVENOUS | Status: DC | PRN
  Administered 2020-02-19: 125 ug/kg/min via INTRAVENOUS

## 2020-02-19 MED ORDER — LACTATED RINGERS IV SOLN: INTRAVENOUS | Status: DC | PRN

## 2020-02-19 NOTE — Discharge Instructions (Signed)
TEE  YOU HAD AN CARDIAC PROCEDURE TODAY: Refer to the procedure report and other information in the discharge instructions given to you for any specific questions about what was found during the examination. If this information does not answer your questions, please call [facility] office at [phone] to clarify.   DIET: Your first meal following the procedure should be a light meal and then it is ok to progress to your normal diet. A half-sandwich or bowl of soup is an example of a good first meal. Heavy or fried foods are harder to digest and may make you feel nauseous or bloated. Drink plenty of fluids but you should avoid alcoholic beverages for 24 hours. If you had a esophageal dilation, please see attached instructions for diet.   ACTIVITY: Your care partner should take you home directly after the procedure. You should plan to take it easy, moving slowly for the rest of the day. You can resume normal activity the day after the procedure however YOU SHOULD NOT DRIVE, use power tools, machinery or perform tasks that involve climbing or major physical exertion for 24 hours (because of the sedation medicines used during the test).   SYMPTOMS TO REPORT IMMEDIATELY: A cardiologist can be reached at any hour. Please call [phone] for any of the following symptoms:  Vomiting of blood or coffee ground material  New, significant abdominal pain  New, significant chest pain or pain under the shoulder blades  Painful or persistently difficult swallowing  New shortness of breath  Black, tarry-looking or red, bloody stools  FOLLOW UP:  Please also call with any specific questions about appointments or follow up tests.  Electrical Cardioversion Electrical cardioversion is the delivery of a jolt of electricity to restore a normal rhythm to the heart. A rhythm that is too fast or is not regular keeps the heart from pumping well. In this procedure, sticky patches or metal paddles are placed on  the chest to deliver electricity to the heart from a device. This procedure may be done in an emergency if:  There is low or no blood pressure as a result of the heart rhythm.  Normal rhythm must be restored as fast as possible to protect the brain and heart from further damage.  It may save a life. This may also be a scheduled procedure for irregular or fast heart rhythms that are not immediately life-threatening.  What can I expect after the procedure?  Your blood pressure, heart rate, breathing rate, and blood oxygen level will be monitored until you leave the hospital or clinic.  Your heart rhythm will be watched to make sure it does not change.  You may have some redness on the skin where the shocks were given. Over the counter cortizone cream may be helpful.  Follow these instructions at home:  Do not drive for 24 hours if you were given a sedative during your procedure.  Take over-the-counter and prescription medicines only as told by your health care provider.  Ask your health care provider how to check your pulse. Check it often.  Rest for 48 hours after the procedure or as told by your health care provider.  Avoid or limit your caffeine use as told by your health care provider.  Keep all follow-up visits as told by your health care provider. This is important. Contact a health care provider if:  You feel like your heart is beating too quickly or your pulse is not regular.  You have a serious muscle cramp   that does not go away. Get help right away if:  You have discomfort in your chest.  You are dizzy or you feel faint.  You have trouble breathing or you are short of breath.  Your speech is slurred.  You have trouble moving an arm or leg on one side of your body.  Your fingers or toes turn cold or blue. Summary  Electrical cardioversion is the delivery of a jolt of electricity to restore a normal rhythm to the heart.  This procedure may be done right away  in an emergency or may be a scheduled procedure if the condition is not an emergency.  Generally, this is a safe procedure.  After the procedure, check your pulse often as told by your health care provider. This information is not intended to replace advice given to you by your health care provider. Make sure you discuss any questions you have with your health care provider. Document Revised: 09/10/2018 Document Reviewed: 09/10/2018 Elsevier Patient Education  2020 [employer] 

## 2020-02-19 NOTE — Telephone Encounter (Signed)
-----   Message from Graciella Freer, PA-C sent at 02/19/2020 11:13 AM EST ----- EF has decreased from previous in setting of AF with RVR over several weeks.    Would recommend he start losartan 25 mg daily to help improve his heart function along with him now being back in sinus rhythm. Will need BMET at Dr. Elberta Fortis visit on 1/11.    Casimiro Needle [address] [place], PA-C  02/19/2020 11:13 AM

## 2020-02-19 NOTE — Telephone Encounter (Signed)
Per DPR spoke with patients wife who is aware and agreeable to TEE results and recommendations of adding losartan 25 mg QD RX sent to [facility] per patient request

## 2020-02-19 NOTE — Transfer of Care (Signed)
Immediate Anesthesia Transfer of Care Note  Patient: Patrick Chase  Procedure(s) Performed: TRANSESOPHAGEAL ECHOCARDIOGRAM (TEE) (N/A ) CARDIOVERSION (N/A )  Patient Location: Endoscopy Unit  Anesthesia Type:MAC  Level of Consciousness: drowsy and patient cooperative  Airway & Oxygen Therapy: Patient Spontanous Breathing and Patient connected to nasal cannula oxygen  Post-op Assessment: Report given to RN and Post -op Vital signs reviewed and stable  Post vital signs: Reviewed and stable  Last Vitals:  Vitals Value Taken Time  BP 102/69   Temp    Pulse    Resp    SpO2 95     Last Pain:  Vitals:   02/19/20 0717  TempSrc: Temporal  PainSc: 0-No pain         Complications: No complications documented.

## 2020-02-19 NOTE — Progress Notes (Signed)
  Echocardiogram Echocardiogram Transesophageal has been performed.  Patrick Chase 02/19/2020, 8:52 AM

## 2020-02-19 NOTE — Interval H&P Note (Signed)
History and Physical Interval Note:  02/19/2020 8:14 AM  Patrick Chase  has presented today for surgery, with the diagnosis of AFIB.  The various methods of treatment have been discussed with the patient and family. After consideration of risks, benefits and other options for treatment, the patient has consented to  Procedure(s): TRANSESOPHAGEAL ECHOCARDIOGRAM (TEE) (N/A) CARDIOVERSION (N/A) as a surgical intervention.  The patient's history has been reviewed, patient examined, no change in status, stable for surgery.  I have reviewed the patient's chart and labs.  Questions were answered to the patient's satisfaction.     [employer]

## 2020-02-19 NOTE — Anesthesia Procedure Notes (Signed)
Procedure Name: MAC Date/Time: 02/19/2020 8:22 AM Performed by: Kathryne Hitch, CRNA Pre-anesthesia Checklist: Patient identified, Emergency Drugs available, Suction available and Patient being monitored Patient Re-evaluated:Patient Re-evaluated prior to induction Oxygen Delivery Method: Nasal cannula Induction Type: IV induction Placement Confirmation: positive ETCO2 Dental Injury: Teeth and Oropharynx as per pre-operative assessment

## 2020-02-19 NOTE — CV Procedure (Signed)
   Transesophageal Echocardiogram  Indications: AFIB/flutter  Time out performed  Propofol, anesthesia  Findings:  Left Ventricle: Reduced EF 30-35%  Mitral Valve: Mild MR  Aortic Valve: Normal  Tricuspid Valve: Normal  Left Atrium: Normal, no LAA thrombus   Donato Schultz, MD     Electrical Cardioversion Procedure Note Patrick Chase [id number] 1986-01-17  Procedure: Electrical Cardioversion Indications:  Atrial Flutter  Time Out: Verified patient identification, verified procedure,medications/allergies/relevent history reviewed, required imaging and test results available.  Performed  Procedure Details  The patient was NPO after midnight. Anesthesia was administered at the beside  by Dr.Houser with propofol.  Cardioversion was performed with synchronized biphasic defibrillation via AP pads with 120 joules.  1 attempt(s) were performed.  The patient converted to normal sinus rhythm. The patient tolerated the procedure well   IMPRESSION:  Successful cardioversion of atrial flutter (atypical).  Treat cardiomyopathy (EF 30-35%)  Donato Schultz 02/19/2020, 8:37 AM

## 2020-02-19 NOTE — Anesthesia Postprocedure Evaluation (Signed)
Anesthesia Post Note  Patient: Patrick Chase  Procedure(s) Performed: TRANSESOPHAGEAL ECHOCARDIOGRAM (TEE) (N/A ) CARDIOVERSION (N/A )     Patient location during evaluation: Endoscopy Anesthesia Type: MAC Level of consciousness: awake and alert Pain management: pain level controlled Vital Signs Assessment: post-procedure vital signs reviewed and stable Respiratory status: spontaneous breathing, nonlabored ventilation, respiratory function stable and patient connected to nasal cannula oxygen Cardiovascular status: blood pressure returned to baseline and stable Postop Assessment: no apparent nausea or vomiting Anesthetic complications: no   No complications documented.  Last Vitals:  Vitals:   02/19/20 0717 02/19/20 0845  BP: (!) 152/104 102/69  Pulse: (!) 102 74  Resp: 10 12  Temp: 36.9 C 36.6 C  SpO2: 100% 95%    Last Pain:  Vitals:   02/19/20 0845  TempSrc: Oral  PainSc: [place]

## 2020-03-03 ENCOUNTER — Ambulatory Visit: Payer: BC Managed Care – PPO | Admitting: Cardiology

## 2020-03-03 ENCOUNTER — Encounter: Payer: Self-pay | Admitting: Cardiology

## 2020-03-03 ENCOUNTER — Other Ambulatory Visit: Payer: Self-pay

## 2020-03-03 VITALS — BP 154/90 | HR 60 | Ht 72.0 in | Wt 197.4 lb

## 2020-03-03 DIAGNOSIS — Z01812 Encounter for preprocedural laboratory examination: Secondary | ICD-10-CM

## 2020-03-03 DIAGNOSIS — I480 Paroxysmal atrial fibrillation: Secondary | ICD-10-CM | POA: Diagnosis not present

## 2020-03-03 NOTE — Patient Instructions (Addendum)
Medication Instructions:  Your physician recommends that you continue on your current medications as directed. Please refer to the Current Medication list given to you today.  *If you need a refill on your cardiac medications before your next appointment, please call your pharmacy*   Lab Work: Stop by a [facility] between 04/06/20 - 04/21/20 for pre procedure labs.  If you have labs (blood work) drawn today and your tests are completely normal, you will receive your results only by: [name] MyChart Message (if you have MyChart) OR . A paper copy in the mail If you have any lab test that is abnormal or we need to change your treatment, we will call you to review the results.   Testing/Procedures: Your physician has requested that you have cardiac CT within 7 days PRIOR to procedure. Cardiac computed tomography (CT) is a painless test that uses an x-ray machine to take clear, detailed pictures of your heart. . Please follow instructions below located under "other instructions".   Your physician has recommended that you have an ablation. Catheter ablation is a medical procedure used to treat some cardiac arrhythmias (irregular heartbeats). During catheter ablation, a long, thin, flexible tube is put into a blood vessel in your groin (upper thigh), or neck. This tube is called an ablation catheter. It is then guided to your heart through the blood vessel. Radio frequency waves destroy small areas of heart tissue where abnormal heartbeats may cause an arrhythmia to start. Please follow instructions below located under "other instructions".    Follow-Up: At [facility], you and your health needs are our priority.  As part of our continuing mission to provide you with exceptional heart care, we have created designated Provider Care Teams.  These Care Teams include your primary Cardiologist (physician) and Advanced Practice Providers (APPs -  Physician Assistants and Nurse Practitioners) who all work together  to provide you with the care you need, when you need it.  Your next appointment:   4 week(s) after your ablation  The format for your next appointment:   In Person  Provider:   Loman Brooklyn, MD    Thank you for choosing [facility]!!   Dory Horn, RN [phone]   Other Instructions  CT INSTRUCTIONS Your cardiac CT will be scheduled at:  [facility] [address] [place], [place] [address] [phone]  Please arrive at the [facility] main entrance of [facility] 30 minutes prior to test start time. Proceed to the [facility] Radiology Department (first floor) to check-in and test prep.  Please follow these instructions carefully (unless otherwise directed):  Hold all erectile dysfunction medications at least 3 days (72 hrs) prior to test.  On the Night Before the Test: . Be sure to Drink plenty of water. . Do not consume any caffeinated/decaffeinated beverages or chocolate 12 hours prior to your test. . Do not take any antihistamines 12 hours prior to your test.  On the Day of the Test: . Drink plenty of water. Do not drink any water within one hour of the test. . Do not eat any food 4 hours prior to the test. . You may take your regular medications prior to the test.  . Take metoprolol (Lopressor) two hours prior to test. . HOLD Furosemide/Hydrochlorothiazide morning of the test.      After the Test: . Drink plenty of water. . After receiving IV contrast, you may experience a mild flushed feeling. This is normal. . On occasion, you  may experience a mild rash up to 24 hours after the test. This is not dangerous. If this occurs, you can take Benadryl 25 mg and increase your fluid intake. . If you experience trouble breathing, this can be serious. If it is severe call 911 IMMEDIATELY. If it is mild, please call our office. . If you take any of these medications: Glipizide/Metformin, Avandament, Glucavance, please do not take 48 hours  after completing test unless otherwise instructed.   Once we have confirmed authorization from your insurance company, we will call you to set up a date and time for your test. Based on how quickly your insurance processes prior authorizations requests, please allow up to 4 weeks to be contacted for scheduling your Cardiac CT appointment. Be advised that routine Cardiac CT appointments could be scheduled as many as 8 weeks after your provider has ordered it.  For non-scheduling related questions, please contact the cardiac imaging nurse navigator should you have any questions/concerns: Marchia Bond, Cardiac Imaging Nurse Navigator Burley Saver, Interim Cardiac Imaging Nurse [place] [facility] Direct Office Dial: [phone]   For scheduling needs, including cancellations and rescheduling, please call [place], [phone].     Electrophysiology/Ablation Procedure Instructions   You are scheduled for a(n)  ablation on 04/30/20 with Dr. Allegra Lai.   1.   Pre procedure testing-             A.  LAB WORK --- between 04/06/20 - 04/21/20 for your pre procedure blood work.  You do NOT need to be fasting.  You can stop by a [place] TEST-- On 04/28/20 @ 12:00 pm - This is a Drive Up Visit at [address] [place], [place] [address].  Someone will direct you to the appropriate testing line. Stay in your car and someone will be with you shortly.   After you are tested please go home and self quarantine until the day of your procedure.     2. On the day of your procedure 04/30/20 you will go to [facility] [phone]) at 8:30 am.  Dennis Bast will go to the main entrance A [employer]) and enter where the [employer] are.  Your driver will drop you off and you will head down the hallway to ADMITTING.  You may have one support person come in to the hospital with you.  They will be asked to wait in the waiting room.   3.   Do not  eat or drink after midnight prior to your procedure.   4.   Do not miss any doses of your blood thinner prior to the morning of your procedure or your procedure will need to be rescheduled.       Do NOT take any medications the morning of your procedure.   5.  Plan for an overnight stay, but you may be discharged home after your procedure.    If you use your phone frequently bring your phone charger, in case you have to stay.  If you are discharged after your procedure you will need someone to drive you home and be with your for 24 hours after your procedure.   6. You will follow up with the AFIB clinic 4 weeks after your procedure.  You will follow up with Dr. Curt Bears  3 months after your procedure.  These appointments will be made for  you.   * If you have ANY questions please call the office [phone] and ask for Emogene Muratalla RN or send me a MyChart message   * Occasionally, EP Studies and ablations can become lengthy.  Please make your family aware of this before your procedure starts.  Average time ranges from 2-8 hours for EP studies/ablations.  Your physician will call your family after the procedure with the results.

## 2020-03-03 NOTE — Progress Notes (Signed)
Electrophysiology Office Note   Date:  03/03/2020   ID:  Patrick Chase, DOB [birth year], MRN [id number]  PCP:  Patient, [name]  Cardiologist:  Purvis Sheffield Primary Electrophysiologist:  Patrick Chase Patrick Loa, MD    Chief Complaint: AF   History of Present Illness: Patrick Chase is a 35 y.o. male who is being seen today for the evaluation of AF at the request of No ref. provider found. Presenting today for electrophysiology evaluation.  He has a history of paroxysmal atrial fibrillation, ulcerative colitis, hypertension, and PVCs. He was diagnosed with atrial fibrillation in 2016. He was started on beta-blockers at the time. He has significant alcohol abuse drinking 6-8 beers on a daily basis. He does drink more on the weekends.  He presented to cardiology clinic 02/02/2020 with palpitations. He feels that he had been in atrial fibrillation for 2 to 3 weeks. He is not anticoagulated due to a low stroke risk. He had significantly less energy, mild shortness of breath. He is status post TEE and cardioversion 02/19/2020.  Today, denies symptoms of chest pain, shortness of breath, orthopnea, PND, lower extremity edema, claudication, dizziness, presyncope, syncope, bleeding, or neurologic sequela. The patient is tolerating medications without difficulties.  Since his cardioversion he has continued to have palpitations.  They are not nearly as bad as they were prior to cardioversion.  He is able to do all of his daily activities with some shortness of breath and fatigue.   Past Medical History:  Diagnosis Date  . Atrial fibrillation (HCC)   . Hypertension    Past Surgical History:  Procedure Laterality Date  . BIOPSY  08/02/2018   Procedure: BIOPSY;  Surgeon: Patrick Hippo, MD;  Location: [facility];  Service: Endoscopy;;  . CARDIOVERSION N/A 02/19/2020   Procedure: CARDIOVERSION;  Surgeon: Patrick Bathe, MD;  Location: [facility] ENDOSCOPY;  Service: Cardiovascular;  Laterality: N/A;  .  COLONOSCOPY N/A 08/02/2018   Procedure: COLONOSCOPY;  Surgeon: Patrick Hippo, MD;  Location: [facility]O [facility];  Service: Endoscopy;  Laterality: N/A;  1230pm  . TEE WITHOUT CARDIOVERSION N/A 02/19/2020   Procedure: TRANSESOPHAGEAL ECHOCARDIOGRAM (TEE);  Surgeon: Patrick Bathe, MD;  Location: [facility] ENDOSCOPY;  Service: Cardiovascular;  Laterality: N/A;     Current Outpatient Medications  Medication Sig Dispense Refill  . apixaban (ELIQUIS) 5 MG TABS tablet Take 1 tablet (5 mg total) by mouth 2 (two) times daily. 60 tablet 11  . losartan (COZAAR) 25 MG tablet Take 1 tablet (25 mg total) by mouth daily. 30 tablet 2  . metoprolol succinate (TOPROL-XL) 100 MG 24 hr tablet Take 1 tablet (100 mg total) by mouth at bedtime. Take with or immediately following a meal. 90 tablet 3   No current facility-administered medications for this visit.    Allergies:   Patient has no known allergies.   Social History:  The patient  reports that he has been smoking cigarettes. He started smoking about 22 years ago. He has a 0.60 pack-year smoking history. He has never used smokeless tobacco. He reports current alcohol use of about 6.0 - 8.0 standard drinks of alcohol per week. He reports that he does not use drugs.   Family History:  The patient's family history includes Hypertension in an other family member.   ROS:  Please see the history of present illness.   Otherwise, review of systems is positive for none.   All other systems are reviewed and negative.   PHYSICAL EXAM: VS:  BP [name])  154/90   Pulse 60   Ht 6' (1.829 m)   Wt 197 lb 6.4 oz (89.5 kg)   SpO2 99%   BMI 26.77 kg/m  , BMI Body mass index is 26.77 kg/m. GEN: Well nourished, well developed, in no acute distress  HEENT: normal  Neck: no JVD, carotid bruits, or masses Cardiac: RRR; no murmurs, rubs, or gallops,no edema  Respiratory:  clear to auscultation bilaterally, normal work of breathing GI: soft, nontender, nondistended, + BS MS: no  deformity or atrophy  Skin: warm and dry Neuro:  Strength and sensation are intact Psych: euthymic mood, full affect  EKG:  EKG is ordered today. Personal review of the ekg ordered shows sinus rhythm, PACs, rate 60  Recent Labs: 02/12/2020: BUN 17; Creatinine, Ser 0.99; Hemoglobin 18.4; Platelets 325; Potassium 4.7; Sodium 139    Lipid Panel  No results found for: CHOL, TRIG, HDL, CHOLHDL, VLDL, LDLCALC, LDLDIRECT   Wt Readings from Last 3 Encounters:  03/03/20 197 lb 6.4 oz (89.5 kg)  02/19/20 194 lb 0.1 oz (88 kg)  02/12/20 194 lb 12.8 oz (88.4 kg)      Other studies Reviewed: Additional studies/ records that were reviewed today include: TEE 02/19/2020 Review of the above records today demonstrates:  1. Left ventricular ejection fraction, by estimation, is 30 to 35%. The  left ventricle has moderately decreased function. The left ventricle  demonstrates global hypokinesis. The left ventricular internal cavity size  was mildly dilated.  2. Right ventricular systolic function is normal. The right ventricular  size is normal.  3. Left atrial size was mildly dilated. No left atrial/left atrial  appendage thrombus was detected.  4. The mitral valve is normal in structure. Mild mitral valve  regurgitation.  5. The aortic valve is normal in structure. Aortic valve regurgitation is  not visualized. No aortic stenosis is present.   Cardiac monitor 12/07/2018 personally reviewed Max 272 bpm 06:07pm, 09/22 Min 42 bpm 03:45am, 09/22 Avg 85 bpm 254 Ventricular Tachycardia runs occurred, the longest lasting 8.1 secs with an avg rate of 184 bpm. 1406 Supraventricular Tachycardia runs occurred, the longest lasting 2 mins 52 secs with an avg rate of 153 bpm. 10% PVCs, 3% PACs   ASSESSMENT AND PLAN:  1. Persistent atrial fibrillation/atrial flutter/atrial tachycardia: CHA2DS2-VASc of 0. Was diagnosed in 2016. Normal TSH. Currently on diltiazem. Currently on Eliquis after a  recent cardioversion.  At this point, he would like to avoid medications.  Due to that, we [name] plan for ablation.  He does have a atrial fibrillation, atrial tachycardia, and atrial flutter.  His atrial flutter does appear to be somewhat atypical.  Risks and benefits of ablation were discussed which were bleeding, tamponade, heart block, stroke, damage to chest organs.  He understands these risks and has agreed to the procedure.  2. PVCs: 10% on most recent cardiac monitor.  None noted on ECG today  3. alcohol abuse: Cessation encouraged  Four.  Systolic heart failure, chronic currently on Toprol-XL and Cozaar.  May need to make changes to his blood pressure medications post ablation.  I do expect his ejection fraction to improve if he remains in sinus rhythm.  Current medicines are reviewed at length with the patient today.   The patient does not have concerns regarding his medicines.  The following changes were made today: none  Labs/ tests ordered today include:  Orders Placed This Encounter  Procedures  . CT CARDIAC MORPH/PULM VEIN W/CM&W/O CA SCORE  . Basic metabolic  panel  . CBC  . EKG 12-Lead     Disposition:   FU with Jazalyn Mondor 3 months  Signed, Caylin Raby Patrick Loa, MD  03/03/2020 9:57 AM     [facility] [address] [place] [address] [phone] (office) [phone] (fax)

## 2020-03-18 DIAGNOSIS — S134XXA Sprain of ligaments of cervical spine, initial encounter: Secondary | ICD-10-CM | POA: Diagnosis not present

## 2020-03-18 DIAGNOSIS — S335XXA Sprain of ligaments of lumbar spine, initial encounter: Secondary | ICD-10-CM | POA: Diagnosis not present

## 2020-03-18 DIAGNOSIS — S233XXA Sprain of ligaments of thoracic spine, initial encounter: Secondary | ICD-10-CM | POA: Diagnosis not present

## 2020-03-27 ENCOUNTER — Telehealth: Payer: Self-pay | Admitting: Cardiology

## 2020-03-27 NOTE — Telephone Encounter (Signed)
    Pt said he will be faxing his FMLA papers today to Dr. Elberta Fortis. He requested to send message to let Dr. Elberta Fortis know.

## 2020-03-30 NOTE — Telephone Encounter (Signed)
Attempted to reach pt to inform him of below message, mailbox full, unable to leave a message

## 2020-03-30 NOTE — Telephone Encounter (Signed)
We received his form, I sent an email to the address in Epic requesting a signed authorization & payment of $29 on 03/27/20

## 2020-03-30 NOTE — Telephone Encounter (Signed)
[place] is calling to confirm if the FMLA forms faxed on 03/27/20 by Ericka Pontiff were received. Please advise.

## 2020-04-03 DIAGNOSIS — Z0279 Encounter for issue of other medical certificate: Secondary | ICD-10-CM

## 2020-04-03 NOTE — Telephone Encounter (Signed)
Pt dropped off check for $29. Stated he had already signed the release form.   Where should I interoffice the check to?

## 2020-04-06 NOTE — Telephone Encounter (Signed)
Please interoffice the check to [employer]. [address] [place] Attn: Medical Records - Thank you very much!

## 2020-04-07 ENCOUNTER — Telehealth: Payer: Self-pay | Admitting: Cardiology

## 2020-04-07 NOTE — Telephone Encounter (Signed)
[facility] received FMLA paperwork from [employer]. Place paperwork in Dr. Gershon Crane box for completion. 04/07/20  KLM

## 2020-04-07 NOTE — Telephone Encounter (Signed)
Sending check interoffice to Bertram Gala, Billing Manager [employer]

## 2020-04-09 NOTE — Telephone Encounter (Signed)
Pt aware that I would have Dr. Elberta Fortis review/sign Monday when he returns to office.  Pt agreeable to plan and appreciates my call. Aware I will call him and let him know once completed/signed paperwork faxed.

## 2020-04-09 NOTE — Telephone Encounter (Signed)
Patient calling back to follow up on his FMLA paperwork. He states it has to be faxed back by 04/14/2020. He would like a call back to update him.

## 2020-04-13 ENCOUNTER — Telehealth: Payer: Self-pay | Admitting: Cardiology

## 2020-04-13 NOTE — Telephone Encounter (Signed)
Forms completed by Dr. Elberta Fortis and faxed to [employer] [phone] on 04/13/20 JG

## 2020-04-13 NOTE — Telephone Encounter (Signed)
New message   Pt would like to know when he needs to have his blood work done by for his ablation. He said if he does not answer to leave the information on his VM for him.

## 2020-04-13 NOTE — Telephone Encounter (Signed)
Pt advised to stop by a [facility] b/t now and next Tuesday. Patient verbalized understanding and agreeable to plan.

## 2020-04-13 NOTE — Telephone Encounter (Signed)
Returned call to pt. Pt aware FMLA paperwork completed & faxed. Aware that we did NOT fill out for intermittent FMLA at this time, can readdress if needed at later date post ablation.  Pt verbalized understanding and agreeable

## 2020-04-16 DIAGNOSIS — Z01812 Encounter for preprocedural laboratory examination: Secondary | ICD-10-CM | POA: Diagnosis not present

## 2020-04-16 DIAGNOSIS — I480 Paroxysmal atrial fibrillation: Secondary | ICD-10-CM | POA: Diagnosis not present

## 2020-04-17 LAB — BASIC METABOLIC PANEL
BUN/Creatinine Ratio: 13 (ref 9–20)
BUN: 12 mg/dL (ref 6–20)
CO2: 24 mmol/L (ref 20–29)
Calcium: 9.6 mg/dL (ref 8.7–10.2)
Chloride: 101 mmol/L (ref 96–106)
Creatinine, Ser: 0.94 mg/dL (ref 0.76–1.27)
GFR calc Af Amer: 122 mL/min/{1.73_m2} (ref >59)
GFR calc non Af Amer: 105 mL/min/{1.73_m2} (ref >59)
Glucose: 84 mg/dL (ref 65–99)
Potassium: 4.2 mmol/L (ref 3.5–5.2)
Sodium: 143 mmol/L (ref 134–144)

## 2020-04-17 LAB — CBC
Hematocrit: 47.8 % (ref 37.5–51.0)
Hemoglobin: 16.7 g/dL (ref 13.0–17.7)
MCH: 32.2 pg (ref 26.6–33.0)
MCHC: 34.9 g/dL (ref 31.5–35.7)
MCV: 92 fL (ref 79–97)
Platelets: 253 10*3/uL (ref 150–450)
RBC: 5.18 x10E6/uL (ref 4.14–5.80)
RDW: 12.3 % (ref 11.6–15.4)
WBC: 10 10*3/uL (ref 3.4–10.8)

## 2020-04-21 ENCOUNTER — Encounter (HOSPITAL_COMMUNITY): Payer: Self-pay

## 2020-04-21 ENCOUNTER — Telehealth (HOSPITAL_COMMUNITY): Payer: Self-pay | Admitting: Emergency Medicine

## 2020-04-21 NOTE — Telephone Encounter (Signed)
Reaching out to patient to offer assistance regarding upcoming cardiac imaging study; pt verbalizes understanding of appt date/time, parking situation and where to check in, pre-test NPO status and medications ordered, and verified current allergies; name and call back number provided for further questions should they arise Rockwell Alexandria RN Navigator Cardiac Imaging [facility] [phone] office [phone] cell   Medications per usual

## 2020-04-23 ENCOUNTER — Other Ambulatory Visit: Payer: Self-pay

## 2020-04-23 ENCOUNTER — Ambulatory Visit (HOSPITAL_COMMUNITY)
Admission: RE | Admit: 2020-04-23 | Discharge: 2020-04-23 | Disposition: A | Discharge status: Home / Self Care | Payer: BC Managed Care – PPO | Source: Ambulatory Visit | Attending: Cardiology | Admitting: Cardiology

## 2020-04-23 DIAGNOSIS — I480 Paroxysmal atrial fibrillation: Secondary | ICD-10-CM | POA: Diagnosis not present

## 2020-04-23 MED ORDER — IOHEXOL 350 MG/ML SOLN
80.0000 mL | Freq: Once | INTRAVENOUS | Status: AC | PRN
  Administered 2020-04-23: 80 mL via INTRAVENOUS

## 2020-04-23 MED ORDER — SODIUM CHLORIDE 0.9 % IV BOLUS
250.0000 mL | Freq: Once | INTRAVENOUS | Status: AC
  Administered 2020-04-23: 250 mL via INTRAVENOUS

## 2020-04-23 MED ORDER — METOPROLOL TARTRATE 5 MG/5ML IV SOLN
INTRAVENOUS | Status: AC
  Administered 2020-04-23: 5 mg
  Filled 2020-04-23: qty 15

## 2020-04-23 MED ORDER — METOPROLOL TARTRATE 5 MG/5ML IV SOLN: 5.0000 mg | INTRAVENOUS | Status: DC | PRN

## 2020-04-23 NOTE — Progress Notes (Signed)
Patient came in and when placed on monitor is in Atrial Fib with RVR 135 to 170, BP 135 85.  Denies SOB chest pain, dizziness or lightheadedness. Color pink and skin warm and dry. Alert and oriented. Feels tired. Given one dose Metoprolol and heart rate 100 to 120 and BP 97/75. Feels as earlies. Called Dr Duke Salvia with above info. Give 250 cc of NS which is started and then do scan retrospectively.

## 2020-04-23 NOTE — Progress Notes (Signed)
Taken to CT. Still in atrial fib with RVR as high as 160, mainly 120 to 140. BP at 0840 112 82. Called Dr Duke Salvia to let her know VS. Patient not dizzy even with standing, not light headed, color pink and skin warm and dry, alert and oriented. Dr Duke Salvia stated he is OK to be discharged post scan. CT will discharge patient.

## 2020-04-28 ENCOUNTER — Other Ambulatory Visit (HOSPITAL_COMMUNITY)
Admission: RE | Admit: 2020-04-28 | Discharge: 2020-04-28 | Disposition: A | Discharge status: Home / Self Care | Payer: BC Managed Care – PPO | Source: Ambulatory Visit | Attending: Cardiology | Admitting: Cardiology

## 2020-04-28 DIAGNOSIS — I4819 Other persistent atrial fibrillation: Secondary | ICD-10-CM | POA: Diagnosis not present

## 2020-04-28 DIAGNOSIS — I471 Supraventricular tachycardia: Secondary | ICD-10-CM | POA: Diagnosis not present

## 2020-04-28 DIAGNOSIS — Z20822 Contact with and (suspected) exposure to covid-19: Secondary | ICD-10-CM | POA: Insufficient documentation

## 2020-04-28 DIAGNOSIS — I10 Essential (primary) hypertension: Secondary | ICD-10-CM | POA: Diagnosis not present

## 2020-04-28 DIAGNOSIS — F17210 Nicotine dependence, cigarettes, uncomplicated: Secondary | ICD-10-CM | POA: Diagnosis not present

## 2020-04-28 DIAGNOSIS — Z01812 Encounter for preprocedural laboratory examination: Secondary | ICD-10-CM | POA: Insufficient documentation

## 2020-04-28 DIAGNOSIS — I4892 Unspecified atrial flutter: Secondary | ICD-10-CM | POA: Diagnosis not present

## 2020-04-28 LAB — SARS CORONAVIRUS 2 (TAT 6-24 HRS): SARS Coronavirus 2: NEGATIVE

## 2020-04-29 NOTE — Pre-Procedure Instructions (Signed)
Instructed patient on the following items: Arrival time 0830 Nothing to eat or drink after midnight No meds AM of procedure Responsible person to drive you home and stay with you for 24 hrs  Have you missed any doses of anti-coagulant Eliquis- hasn't missed any doses   

## 2020-04-30 ENCOUNTER — Ambulatory Visit (HOSPITAL_COMMUNITY): Payer: BC Managed Care – PPO | Admitting: Anesthesiology

## 2020-04-30 ENCOUNTER — Encounter (HOSPITAL_COMMUNITY)
Admission: RE | Disposition: A | Discharge status: Home / Self Care | Payer: Self-pay | Source: Home / Self Care | Attending: Cardiology

## 2020-04-30 ENCOUNTER — Ambulatory Visit (HOSPITAL_COMMUNITY)
Admission: RE | Admit: 2020-04-30 | Discharge: 2020-04-30 | Disposition: A | Discharge status: Home / Self Care | Payer: BC Managed Care – PPO | Attending: Cardiology | Admitting: Cardiology

## 2020-04-30 DIAGNOSIS — I4819 Other persistent atrial fibrillation: Secondary | ICD-10-CM | POA: Insufficient documentation

## 2020-04-30 DIAGNOSIS — I10 Essential (primary) hypertension: Secondary | ICD-10-CM | POA: Diagnosis not present

## 2020-04-30 DIAGNOSIS — Z20822 Contact with and (suspected) exposure to covid-19: Secondary | ICD-10-CM | POA: Insufficient documentation

## 2020-04-30 DIAGNOSIS — F17210 Nicotine dependence, cigarettes, uncomplicated: Secondary | ICD-10-CM | POA: Diagnosis not present

## 2020-04-30 DIAGNOSIS — I4892 Unspecified atrial flutter: Secondary | ICD-10-CM | POA: Insufficient documentation

## 2020-04-30 DIAGNOSIS — I471 Supraventricular tachycardia: Secondary | ICD-10-CM | POA: Diagnosis not present

## 2020-04-30 DIAGNOSIS — I493 Ventricular premature depolarization: Secondary | ICD-10-CM | POA: Diagnosis not present

## 2020-04-30 DIAGNOSIS — I480 Paroxysmal atrial fibrillation: Secondary | ICD-10-CM | POA: Diagnosis not present

## 2020-04-30 DIAGNOSIS — I4891 Unspecified atrial fibrillation: Secondary | ICD-10-CM | POA: Diagnosis not present

## 2020-04-30 HISTORY — PX: ATRIAL FIBRILLATION ABLATION: EP1191

## 2020-04-30 LAB — POCT ACTIVATED CLOTTING TIME
Activated Clotting Time: 309 seconds
Activated Clotting Time: 333 seconds
Activated Clotting Time: 350 seconds

## 2020-04-30 SURGERY — ATRIAL FIBRILLATION ABLATION
Anesthesia: General

## 2020-04-30 MED ORDER — ONDANSETRON HCL 4 MG/2ML IJ SOLN: 4.0000 mg | Freq: Four times a day (QID) | INTRAMUSCULAR | Status: DC | PRN

## 2020-04-30 MED ORDER — ACETAMINOPHEN 500 MG PO TABS
1000.0000 mg | ORAL_TABLET | Freq: Once | ORAL | Status: AC
  Administered 2020-04-30: 1000 mg via ORAL
  Filled 2020-04-30: qty 2

## 2020-04-30 MED ORDER — PROTAMINE SULFATE 10 MG/ML IV SOLN
INTRAVENOUS | Status: DC | PRN
  Administered 2020-04-30: 40 mg via INTRAVENOUS

## 2020-04-30 MED ORDER — HEPARIN SODIUM (PORCINE) 1000 UNIT/ML IJ SOLN
INTRAMUSCULAR | Status: DC | PRN
  Administered 2020-04-30: 2000 [IU] via INTRAVENOUS
  Administered 2020-04-30: 1000 [IU] via INTRAVENOUS
  Administered 2020-04-30: 15000 [IU] via INTRAVENOUS

## 2020-04-30 MED ORDER — MIDAZOLAM HCL 5 MG/5ML IJ SOLN
INTRAMUSCULAR | Status: DC | PRN
  Administered 2020-04-30: 2 mg via INTRAVENOUS

## 2020-04-30 MED ORDER — HEPARIN (PORCINE) IN NACL 1000-0.9 UT/500ML-% IV SOLN
INTRAVENOUS | Status: AC
  Filled 2020-04-30: qty 2500

## 2020-04-30 MED ORDER — SODIUM CHLORIDE 0.9 % IV SOLN: 250.0000 mL | INTRAVENOUS | Status: DC | PRN

## 2020-04-30 MED ORDER — SODIUM CHLORIDE 0.9 % IV SOLN: INTRAVENOUS | Status: DC

## 2020-04-30 MED ORDER — PROPOFOL 10 MG/ML IV BOLUS
INTRAVENOUS | Status: DC | PRN
  Administered 2020-04-30 (×2): 50 mg via INTRAVENOUS
  Administered 2020-04-30: 150 mg via INTRAVENOUS

## 2020-04-30 MED ORDER — DEXAMETHASONE SODIUM PHOSPHATE 10 MG/ML IJ SOLN
INTRAMUSCULAR | Status: DC | PRN
  Administered 2020-04-30: 5 mg via INTRAVENOUS

## 2020-04-30 MED ORDER — ROCURONIUM BROMIDE 10 MG/ML (PF) SYRINGE
PREFILLED_SYRINGE | INTRAVENOUS | Status: DC | PRN
  Administered 2020-04-30: 100 mg via INTRAVENOUS

## 2020-04-30 MED ORDER — SODIUM CHLORIDE 0.9% FLUSH: 3.0000 mL | Freq: Two times a day (BID) | INTRAVENOUS | Status: DC

## 2020-04-30 MED ORDER — PHENYLEPHRINE HCL-NACL 10-0.9 MG/250ML-% IV SOLN
INTRAVENOUS | Status: DC | PRN
  Administered 2020-04-30: 20 ug/min via INTRAVENOUS

## 2020-04-30 MED ORDER — ACETAMINOPHEN 325 MG PO TABS: 650.0000 mg | ORAL_TABLET | ORAL | Status: DC | PRN

## 2020-04-30 MED ORDER — SODIUM CHLORIDE 0.9% FLUSH
3.0000 mL | INTRAVENOUS | Status: DC | PRN
Start: 2020-04-30 — End: 2020-04-30

## 2020-04-30 MED ORDER — DEXMEDETOMIDINE (PRECEDEX) IN NS 20 MCG/5ML (4 MCG/ML) IV SYRINGE
PREFILLED_SYRINGE | INTRAVENOUS | Status: DC | PRN
  Administered 2020-04-30 (×2): 8 ug via INTRAVENOUS

## 2020-04-30 MED ORDER — ONDANSETRON HCL 4 MG/2ML IJ SOLN
INTRAMUSCULAR | Status: DC | PRN
  Administered 2020-04-30: 4 mg via INTRAVENOUS

## 2020-04-30 MED ORDER — LIDOCAINE 2% (20 MG/ML) 5 ML SYRINGE
INTRAMUSCULAR | Status: DC | PRN
  Administered 2020-04-30: 80 mg via INTRAVENOUS

## 2020-04-30 MED ORDER — FENTANYL CITRATE (PF) 100 MCG/2ML IJ SOLN
INTRAMUSCULAR | Status: DC | PRN
  Administered 2020-04-30: 25 ug via INTRAVENOUS
  Administered 2020-04-30: 50 ug via INTRAVENOUS
  Administered 2020-04-30: 25 ug via INTRAVENOUS
  Administered 2020-04-30: 100 ug via INTRAVENOUS

## 2020-04-30 MED ORDER — HEPARIN SODIUM (PORCINE) 1000 UNIT/ML IJ SOLN
INTRAMUSCULAR | Status: DC | PRN
  Administered 2020-04-30: 1000 [IU] via INTRAVENOUS

## 2020-04-30 MED ORDER — FLECAINIDE ACETATE 100 MG PO TABS: 100.0000 mg | ORAL_TABLET | Freq: Two times a day (BID) | ORAL | 6 refills | Status: DC

## 2020-04-30 MED ORDER — HEPARIN (PORCINE) IN NACL 1000-0.9 UT/500ML-% IV SOLN
INTRAVENOUS | Status: DC | PRN
  Administered 2020-04-30 (×5): 500 mL

## 2020-04-30 MED ORDER — HEPARIN SODIUM (PORCINE) 1000 UNIT/ML IJ SOLN
INTRAMUSCULAR | Status: AC
  Filled 2020-04-30: qty 1

## 2020-04-30 SURGICAL SUPPLY — 21 items
BAG SNAP BAND KOVER 36X36 (MISCELLANEOUS) ×1 IMPLANT
BLANKET WARM UNDERBOD FULL ACC (MISCELLANEOUS) ×2 IMPLANT
CATH MAPPNG PENTARAY F 2-6-2MM (CATHETERS) IMPLANT
CATH S CIRCA THERM PROBE 10F (CATHETERS) ×1 IMPLANT
CATH SMTCH THERMOCOOL SF DF (CATHETERS) ×1 IMPLANT
CATH SOUNDSTAR ECO 8FR (CATHETERS) ×2 IMPLANT
CATH WEB BI DIR CSDF CRV REPRO (CATHETERS) ×1 IMPLANT
CLOSURE PERCLOSE PROSTYLE (VASCULAR PRODUCTS) ×4 IMPLANT
COVER SWIFTLINK CONNECTOR (BAG) ×2 IMPLANT
KIT VERSACROSS STEERABLE D1 (CATHETERS) ×1 IMPLANT
PACK EP LATEX FREE (CUSTOM PROCEDURE TRAY) ×2
PACK EP LF (CUSTOM PROCEDURE TRAY) ×1 IMPLANT
PAD PRO RADIOLUCENT 2001M-C (PAD) ×2 IMPLANT
PATCH CARTO3 (PAD) ×1 IMPLANT
PENTARAY F 2-6-2MM (CATHETERS) ×2
SHEATH CARTO VIZIGO SM CVD (SHEATH) ×1 IMPLANT
SHEATH PINNACLE 7F 10CM (SHEATH) ×1 IMPLANT
SHEATH PINNACLE 8F 10CM (SHEATH) ×3 IMPLANT
SHEATH PINNACLE 9F 10CM (SHEATH) ×1 IMPLANT
SHEATH PROBE COVER 6X72 (BAG) ×2 IMPLANT
TUBING SMART ABLATE COOLFLOW (TUBING) ×1 IMPLANT

## 2020-04-30 NOTE — Anesthesia Preprocedure Evaluation (Addendum)
Anesthesia Evaluation  Patient identified by MRN, date of birth, ID band Patient awake    Reviewed: Allergy & Precautions, NPO status , Patient's Chart, lab work & pertinent test results  History of Anesthesia Complications Negative for: history of anesthetic complications  Airway Mallampati: II  TM Distance: >3 FB Neck ROM: Full    Dental no notable dental hx. (+) Teeth Intact, Dental Advisory Given   Pulmonary Current Smoker and Patient abstained from smoking.,    Pulmonary exam normal breath sounds clear to auscultation       Cardiovascular Exercise Tolerance: Good hypertension, Pt. on home beta blockers and Pt. on medications + dysrhythmias Atrial Fibrillation  Rhythm:Irregular Rate:Tachycardia  IMPRESSIONS   1. Left ventricular ejection fraction, by estimation, is 30 to 35%. The left ventricle has moderately decreased function. The left ventricle demonstrates global hypokinesis. The left ventricular internal cavity size was mildly dilated. 2. Right ventricular systolic function is normal. The right ventricular size is normal. 3. Left atrial size was mildly dilated. No left atrial/left atrial appendage thrombus was detected. 4. The mitral valve is normal in structure. Mild mitral valve regurgitation. 5. The aortic valve is normal in structure. Aortic valve regurgitation is not visualized. No aortic stenosis is present.  Comparison(s): EF is reduced when compared to prior study.  Conclusion(s)/Recommendation(s): No LA/LAA thrombus identified. Successful cardioversion performed with restoration of normal sinus rhythm.   Neuro/Psych negative neurological ROS  negative psych ROS   GI/Hepatic negative GI ROS, (+)     substance abuse  alcohol use,   Endo/Other  negative endocrine ROS  Renal/GU negative Renal ROS     Musculoskeletal negative musculoskeletal ROS (+)   Abdominal   Peds  Hematology    Anesthesia Other Findings   Reproductive/Obstetrics                          Anesthesia Physical  Anesthesia Plan  ASA: II  Anesthesia Plan: General   Post-op Pain Management:    Induction:   PONV Risk Score and Plan: 1 and Treatment may vary due to age or medical condition and Ondansetron  Airway Management Planned: Oral ETT  Additional Equipment: None  Intra-op Plan:   Post-operative Plan: Extubation in OR  Informed Consent: I have reviewed the patients History and Physical, chart, labs and discussed the procedure including the risks, benefits and alternatives for the proposed anesthesia with the patient or authorized representative who has indicated his/her understanding and acceptance.     Dental advisory given  Plan Discussed with: CRNA and Anesthesiologist  Anesthesia Plan Comments:        Anesthesia Quick Evaluation

## 2020-04-30 NOTE — Progress Notes (Signed)
Dr. Elberta Fortis by to see and talk to patient.

## 2020-04-30 NOTE — Anesthesia Procedure Notes (Signed)
Procedure Name: Intubation Performed by: Alajia Schmelzer, CRNA Pre-anesthesia Checklist: Patient identified, Emergency Drugs available, Suction available and Patient being monitored Patient Re-evaluated:Patient Re-evaluated prior to induction Oxygen Delivery Method: Circle system utilized Preoxygenation: Pre-oxygenation with 100% oxygen Induction Type: IV induction Ventilation: Mask ventilation without difficulty Laryngoscope Size: Mac and 4 Grade View: Grade I Tube type: Oral Tube size: 7.5 mm Number of attempts: 1 Airway Equipment and Method: Stylet and Oral airway Placement Confirmation: ETT inserted through vocal cords under direct vision,  positive ETCO2 and breath sounds checked- equal and bilateral Secured at: 23 cm Tube secured with: Tape Dental Injury: Teeth and Oropharynx as per pre-operative assessment        

## 2020-04-30 NOTE — Transfer of Care (Signed)
Immediate Anesthesia Transfer of Care Note  Patient: Patrick Chase  Procedure(s) Performed: ATRIAL FIBRILLATION ABLATION (N/A )  Patient Location: PACU  Anesthesia Type:General  Level of Consciousness: awake, alert , oriented and Patient remains intubated per anesthesia plan  Airway & Oxygen Therapy: Patient Spontanous Breathing and Patient connected to nasal cannula oxygen  Post-op Assessment: Report given to RN and Post -op Vital signs reviewed and stable  Post vital signs: Reviewed and stable  Last Vitals:  Vitals Value Taken Time  BP    Temp 36.5 C 04/30/20 1308  Pulse 126 04/30/20 1312  Resp 19 04/30/20 1312  SpO2 97 % 04/30/20 1312  Vitals shown include unvalidated device data.  Last Pain:  Vitals:   04/30/20 1308  TempSrc: Temporal  PainSc: 0-No pain      Patients Stated Pain Goal: 3 (04/30/20 0857)  Complications: No complications documented.

## 2020-04-30 NOTE — Progress Notes (Signed)
Moniter shows SR to ST to bigeminy and trigeminy. Dr. Elberta Fortis paged and made aware.

## 2020-04-30 NOTE — Anesthesia Postprocedure Evaluation (Signed)
Anesthesia Post Note  Patient: Patrick Chase  Procedure(s) Performed: ATRIAL FIBRILLATION ABLATION (N/A )     Patient location during evaluation: PACU Anesthesia Type: General Level of consciousness: sedated Pain management: pain level controlled Vital Signs Assessment: post-procedure vital signs reviewed and stable Respiratory status: spontaneous breathing and respiratory function stable Cardiovascular status: stable Postop Assessment: no apparent nausea or vomiting Anesthetic complications: no   No complications documented.  Last Vitals:  Vitals:   04/30/20 1341 04/30/20 1350  BP:  (!) 98/44  Pulse:  (!) 31  Resp:  13  Temp: 36.6 C   SpO2:  96%    Last Pain:  Vitals:   04/30/20 1341  TempSrc: Temporal  PainSc: 0-No pain                 Bert Givans DANIEL

## 2020-04-30 NOTE — H&P (Signed)
Electrophysiology Office Note   Date:  04/30/2020   ID:  Elisandro, Jarrett [birth year], MRN [id number]  PCP:  Patient, [name]  Cardiologist:  Purvis Sheffield Primary Electrophysiologist:  Will [name], MD    Chief Complaint: AF   History of Present Illness: RAHUL MALINAK is a 35 y.o. male who is being seen today for the evaluation of AF at the request of No ref. provider found. Presenting today for electrophysiology evaluation.  He has a history of paroxysmal atrial fibrillation, ulcerative colitis, hypertension, and PVCs. He was diagnosed with atrial fibrillation in 2016. He was started on beta-blockers at the time. He has significant alcohol abuse drinking 6-8 beers on a daily basis. He does drink more on the weekends.  He presented to cardiology clinic 02/02/2020 with palpitations. He feels that he had been in atrial fibrillation for 2 to 3 weeks. He is not anticoagulated due to a low stroke risk. He had significantly less energy, mild shortness of breath. He is status post TEE and cardioversion 02/19/2020.  Today, denies symptoms of palpitations, chest pain, shortness of breath, orthopnea, PND, lower extremity edema, claudication, dizziness, presyncope, syncope, bleeding, or neurologic sequela. The patient is tolerating medications without difficulties. Plan for ablation today.    Past Medical History:  Diagnosis Date  . Atrial fibrillation (HCC)   . Hypertension    Past Surgical History:  Procedure Laterality Date  . BIOPSY  08/02/2018   Procedure: BIOPSY;  Surgeon: Malissa Hippo, MD;  Location: [facility];  Service: Endoscopy;;  . CARDIOVERSION N/A 02/19/2020   Procedure: CARDIOVERSION;  Surgeon: Jake Bathe, MD;  Location: [facility] ENDOSCOPY;  Service: Cardiovascular;  Laterality: N/A;  . COLONOSCOPY N/A 08/02/2018   Procedure: COLONOSCOPY;  Surgeon: Malissa Hippo, MD;  Location: [facility];  Service: Endoscopy;  Laterality: N/A;  1230pm  . TEE WITHOUT  CARDIOVERSION N/A 02/19/2020   Procedure: TRANSESOPHAGEAL ECHOCARDIOGRAM (TEE);  Surgeon: Jake Bathe, MD;  Location: [facility] ENDOSCOPY;  Service: Cardiovascular;  Laterality: N/A;     Current Facility-Administered Medications  Medication Dose Route Frequency Provider Last Rate Last Admin  . 0.9 %  sodium chloride infusion   Intravenous Continuous Regan Lemming, MD 50 mL/hr at 04/30/20 0902 New Bag at 04/30/20 0902    Allergies:   Patient has no known allergies.   Social History:  The patient  reports that he has been smoking cigarettes. He started smoking about 22 years ago. He has a 0.60 pack-year smoking history. He has never used smokeless tobacco. He reports current alcohol use of about 6.0 - 8.0 standard drinks of alcohol per week. He reports that he does not use drugs.   Family History:  The patient's family history includes Hypertension in an other family member.   ROS:  Please see the history of present illness.   Otherwise, review of systems is positive for none.   All other systems are reviewed and negative.   PHYSICAL EXAM: VS:  BP 121/89   Pulse (!) 57   Temp 98.2 F (36.8 C) (Oral)   Resp 18   Ht 5\' 9"  (1.753 m)   Wt 87.1 kg   SpO2 100%   BMI 28.35 kg/m  , BMI Body mass index is 28.35 kg/m. GEN: Well nourished, well developed, in no acute distress  HEENT: normal  Neck: no JVD, carotid bruits, or masses Cardiac: RRR; no murmurs, rubs, or gallops,no edema  Respiratory:  clear to auscultation bilaterally, normal work  of breathing GI: soft, nontender, nondistended, + BS MS: no deformity or atrophy  Skin: warm and dry Neuro:  Strength and sensation are intact Psych: euthymic mood, full affect    Recent Labs: 04/16/2020: BUN 12; Creatinine, Ser 0.94; Hemoglobin 16.7; Platelets 253; Potassium 4.2; Sodium 143    Lipid Panel  No results found for: CHOL, TRIG, HDL, CHOLHDL, VLDL, LDLCALC, LDLDIRECT   Wt Readings from Last 3 Encounters:  04/30/20 87.1 kg   03/03/20 89.5 kg  02/19/20 88 kg      Other studies Reviewed: Additional studies/ records that were reviewed today include: TEE 02/19/2020 Review of the above records today demonstrates:  1. Left ventricular ejection fraction, by estimation, is 30 to 35%. The  left ventricle has moderately decreased function. The left ventricle  demonstrates global hypokinesis. The left ventricular internal cavity size  was mildly dilated.  2. Right ventricular systolic function is normal. The right ventricular  size is normal.  3. Left atrial size was mildly dilated. No left atrial/left atrial  appendage thrombus was detected.  4. The mitral valve is normal in structure. Mild mitral valve  regurgitation.  5. The aortic valve is normal in structure. Aortic valve regurgitation is  not visualized. No aortic stenosis is present.   Cardiac monitor 12/07/2018 personally reviewed Max 272 bpm 06:07pm, 09/22 Min 42 bpm 03:45am, 09/22 Avg 85 bpm 254 Ventricular Tachycardia runs occurred, the longest lasting 8.1 secs with an avg rate of 184 bpm. 1406 Supraventricular Tachycardia runs occurred, the longest lasting 2 mins 52 secs with an avg rate of 153 bpm. 10% PVCs, 3% PACs   ASSESSMENT AND PLAN:  1. Persistent atrial fibrillation/atrial flutter/atrial tachycardia: HUSSEIN MACDOUGAL has presented today for surgery, with the diagnosis of atrial fibrillation.  The various methods of treatment have been discussed with the patient and family. After consideration of risks, benefits and other options for treatment, the patient has consented to  Procedure(s): Catheter ablation as a surgical intervention .  Risks include but not limited to complete heart block, stroke, esophageal damage, nerve damage, bleeding, vascular damage, tamponade, perforation, MI, and death. The patient's history has been reviewed, patient examined, no change in status, stable for surgery.  I have reviewed the patient's chart and labs.   Questions were answered to the patient's satisfaction.    Will Elberta Fortis, MD 04/30/2020 9:43 AM

## 2020-04-30 NOTE — Discharge Instructions (Signed)
Cardiac Ablation, Care After  This sheet gives you information about how to care for yourself after your procedure. Your health care provider may also give you more specific instructions. If you have problems or questions, contact your health care provider. What can I expect after the procedure? After the procedure, it is common to have:  Bruising around your puncture site.  Tenderness around your puncture site.  Skipped heartbeats.  Tiredness (fatigue).  Follow these instructions at home: Puncture site care   Follow instructions from your health care provider about how to take care of your puncture site. Make sure you: ? If present, leave stitches (sutures), skin glue, or adhesive strips in place. These skin closures may need to stay in place for up to 2 weeks. If adhesive strip edges start to loosen and curl up, you may trim the loose edges. Do not remove adhesive strips completely unless your health care provider tells you to do that. ? If a square bandage is present, this may be removed in 24 hours.   Check your puncture site every day for signs of infection. Check for: ? Redness, swelling, or pain. ? Fluid or blood. If your puncture site starts to bleed, lie down on your back, apply firm pressure to the area, and contact your health care provider. ? Warmth. ? Pus or a bad smell. Driving  Do not drive for at least 4 days after your procedure or however long your health care provider recommends. (Do not resume driving if you have previously been instructed not to drive for other health reasons.)  Do not drive or use heavy machinery while taking prescription pain medicine. Activity  Avoid activities that take a lot of effort for at least 7 days after your procedure.  Do not lift anything that is heavier than 5 lb (4.5 kg) for one week.   No sexual activity for 1 week.   Return to your normal activities as told by your health care provider. Ask your health care provider what  activities are safe for you. General instructions  Take over-the-counter and prescription medicines only as told by your health care provider.  Do not use any products that contain nicotine or tobacco, such as cigarettes and e-cigarettes. If you need help quitting, ask your health care provider.  You may shower after 24 hours, but Do not take baths, swim, or use a hot tub for 1 week.   Do not drink alcohol for 24 hours after your procedure.  Keep all follow-up visits as told by your health care provider. This is important. Contact a health care provider if:  You have redness, mild swelling, or pain around your puncture site.  You have fluid or blood coming from your puncture site that stops after applying firm pressure to the area.  Your puncture site feels warm to the touch.  You have pus or a bad smell coming from your puncture site.  You have a fever.  You have chest pain or discomfort that spreads to your neck, jaw, or arm.  You are sweating a lot.  You feel nauseous.  You have a fast or irregular heartbeat.  You have shortness of breath.  You are dizzy or light-headed and feel the need to lie down.  You have pain or numbness in the arm or leg closest to your puncture site. Get help right away if:  Your puncture site suddenly swells.  Your puncture site is bleeding and the bleeding does not stop after applying firm   pressure to the area. These symptoms may represent a serious problem that is an emergency. Do not wait to see if the symptoms will go away. Get medical help right away. Call your local emergency services (911 in the [place] Do not drive yourself to the hospital. Summary  After the procedure, it is normal to have bruising and tenderness at the puncture site in your groin, neck, or forearm.  Check your puncture site every day for signs of infection.  Get help right away if your puncture site is bleeding and the bleeding does not stop after applying firm  pressure to the area. This is a medical emergency. This information is not intended to replace advice given to you by your health care provider. Make sure you discuss any questions you have with your health care provider.    

## 2020-04-30 NOTE — Progress Notes (Signed)
Pt ambulated without difficulty or bleeding.   Discharged home with wife who will drive and stay with pt x 24 hrs 

## 2020-05-01 ENCOUNTER — Encounter (HOSPITAL_COMMUNITY): Payer: Self-pay | Admitting: Cardiology

## 2020-05-08 ENCOUNTER — Other Ambulatory Visit: Payer: Self-pay

## 2020-05-08 ENCOUNTER — Encounter: Payer: Self-pay | Admitting: Student

## 2020-05-08 ENCOUNTER — Ambulatory Visit: Payer: BC Managed Care – PPO | Admitting: Student

## 2020-05-08 VITALS — BP 124/90 | HR 90 | Ht 69.0 in | Wt 196.2 lb

## 2020-05-08 DIAGNOSIS — I10 Essential (primary) hypertension: Secondary | ICD-10-CM | POA: Diagnosis not present

## 2020-05-08 DIAGNOSIS — Z0279 Encounter for issue of other medical certificate: Secondary | ICD-10-CM

## 2020-05-08 DIAGNOSIS — I493 Ventricular premature depolarization: Secondary | ICD-10-CM | POA: Diagnosis not present

## 2020-05-08 DIAGNOSIS — I480 Paroxysmal atrial fibrillation: Secondary | ICD-10-CM

## 2020-05-08 NOTE — Patient Instructions (Signed)
Medication Instructions:  Your physician recommends that you continue on your current medications as directed. Please refer to the Current Medication list given to you today.  *If you need a refill on your cardiac medications before your next appointment, please call your pharmacy*   Lab Work: TODAY: BMET, MAG.  If you have labs (blood work) drawn today and your tests are completely normal, you will receive your results only by: MyChart Message (if you have MyChart) OR A paper copy in the mail If you have any lab test that is abnormal or we need to change your treatment, we will call you to review the results.   Follow-Up: At [facility], you and your health needs are our priority.  As part of our continuing mission to provide you with exceptional heart care, we have created designated Provider Care Teams.  These Care Teams include your primary Cardiologist (physician) and Advanced Practice Providers (APPs -  Physician Assistants and Nurse Practitioners) who all work together to provide you with the care you need, when you need it.    Your next appointment:   As scheduled   

## 2020-05-08 NOTE — Progress Notes (Signed)
PCP:  Patient, [name] Primary Cardiologist: Prentice Docker, MD (Inactive) Electrophysiologist: Will [name], MD   Patrick Chase is a 35 y.o. male seen today for Will [name], MD for post hospital follow up. Pt had AF ablation 04/30/2020 (see op note).  Since discharge from hospital the patient reports doing very well. He has noticed more energy. He still has very brief tachy palpitations from time to time, consistent with AT noted post procedure.  he denies chest pain, dyspnea, PND, orthopnea, nausea, vomiting, dizziness, syncope, edema, weight gain, or early satiety.  Past Medical History:  Diagnosis Date   Atrial fibrillation (HCC)    Hypertension    Past Surgical History:  Procedure Laterality Date   ATRIAL FIBRILLATION ABLATION N/A 04/30/2020   Procedure: ATRIAL FIBRILLATION ABLATION;  Surgeon: Regan Lemming, MD;  Location: [facility] INVASIVE CV LAB;  Service: Cardiovascular;  Laterality: N/A;   BIOPSY  08/02/2018   Procedure: BIOPSY;  Surgeon: Malissa Hippo, MD;  Location: [facility];  Service: Endoscopy;;   CARDIOVERSION N/A 02/19/2020   Procedure: CARDIOVERSION;  Surgeon: Jake Bathe, MD;  Location: [facility] ENDOSCOPY;  Service: Cardiovascular;  Laterality: N/A;   COLONOSCOPY N/A 08/02/2018   Procedure: COLONOSCOPY;  Surgeon: Malissa Hippo, MD;  Location: [facility];  Service: Endoscopy;  Laterality: N/A;  1230pm   TEE WITHOUT CARDIOVERSION N/A 02/19/2020   Procedure: TRANSESOPHAGEAL ECHOCARDIOGRAM (TEE);  Surgeon: Jake Bathe, MD;  Location: [facility] ENDOSCOPY;  Service: Cardiovascular;  Laterality: N/A;    Current Outpatient Medications  Medication Sig Dispense Refill   apixaban (ELIQUIS) 5 MG TABS tablet Take 1 tablet (5 mg total) by mouth 2 (two) times daily. 60 tablet 11   flecainide (TAMBOCOR) 100 MG tablet Take 1 tablet (100 mg total) by mouth 2 (two) times daily. 60 tablet 6   losartan (COZAAR) 25 MG tablet Take 1 tablet (25 mg total)  by mouth daily. 30 tablet 2   metoprolol succinate (TOPROL-XL) 100 MG 24 hr tablet Take 1 tablet (100 mg total) by mouth at bedtime. Take with or immediately following a meal. 90 tablet 3   omeprazole (PRILOSEC OTC) 20 MG tablet Take 20 mg by mouth daily.     No current facility-administered medications for this visit.    No Known Allergies  Social History   Socioeconomic History   Marital status: Married    Spouse name: Not on file   Number of children: Not on file   Years of education: Not on file   Highest education level: Not on file  Occupational History   Not on file  Tobacco Use   Smoking status: Current Some Day Smoker    Packs/day: 0.05    Years: 12.00    Pack years: 0.60    Types: Cigarettes    Start date: 02/21/1998    Last attempt to quit: 07/15/2013    Years since quitting: 6.8   Smokeless tobacco: Never Used  Substance and Sexual Activity   Alcohol use: Yes    Alcohol/week: 6.0 - 8.0 standard drinks    Types: 6 - 8 Cans of beer per week   Drug use: No   Sexual activity: Not on file  Other Topics Concern   Not on file  Social History Narrative   Not on file   Social Determinants of Health   Financial Resource Strain: Not on file  Food Insecurity: Not on file  Transportation Needs: Not on file  Physical Activity: Not  on file  Stress: Not on file  Social Connections: Not on file  Intimate Partner Violence: Not on file     Review of Systems: All other systems reviewed and are otherwise negative except as noted above.  Physical Exam: Vitals:   05/08/20 1255  BP: 124/90  Pulse: 90  SpO2: 99%  Weight: 196 lb 3.2 oz (89 kg)  Height: 5\' 9"  (1.753 m)    GEN- The patient is well appearing, alert and oriented x 3 today.   HEENT: normocephalic, atraumatic; sclera clear, conjunctiva pink; hearing intact; oropharynx clear; neck supple, no JVP Lymph- no cervical lymphadenopathy Lungs- Clear to ausculation bilaterally, normal work of  breathing.  No wheezes, rales, rhonchi Heart- Regular rate and rhythm, He initially had a brief run of regular, rapid rhythm on exam, that quickly subsided no murmurs, rubs or gallops, PMI not laterally displaced GI- soft, non-tender, non-distended, bowel sounds present, no hepatosplenomegaly Extremities- no clubbing, cyanosis, or edema; DP/PT/radial pulses 2+ bilaterally MS- no significant deformity or atrophy Skin- warm and dry, no rash or lesion Psych- euthymic mood, full affect Neuro- strength and sensation are intact  EKG is ordered. Personal review of EKG from today shows NSR at 90 with PACs  Additional studies reviewed include: Previous EP office and procedure notes  Assessment and Plan:  1. Persistent atrial fibrillation/ atrial flutter/ atrial tachycardia Remains on Eliquis for now post ablation. CHA2DS2VASC of 1 with HTN. Can revisit OAC 3 months post ablation.   Continue diltiazem Continue flecainide 100 mg BID. This was started post ablation with frequent AT and PACs following EKG today shows NSR with PACs. Brief AT noted during exam, but subsided quickly.  2. PVCs Follow.  3. Alcohol abuse Encouraged cessation  , Graciella Freer  05/08/20 1:07 PM

## 2020-05-09 LAB — BASIC METABOLIC PANEL
BUN/Creatinine Ratio: 16 (ref 9–20)
BUN: 15 mg/dL (ref 6–20)
CO2: 23 mmol/L (ref 20–29)
Calcium: 9.6 mg/dL (ref 8.7–10.2)
Chloride: 102 mmol/L (ref 96–106)
Creatinine, Ser: 0.95 mg/dL (ref 0.76–1.27)
Glucose: 93 mg/dL (ref 65–99)
Potassium: 4.7 mmol/L (ref 3.5–5.2)
Sodium: 141 mmol/L (ref 134–144)
eGFR: 108 mL/min/{1.73_m2} (ref >59)

## 2020-05-09 LAB — MAGNESIUM: Magnesium: 2.2 mg/dL (ref 1.6–2.3)

## 2020-05-11 ENCOUNTER — Telehealth: Payer: Self-pay | Admitting: Cardiology

## 2020-05-11 NOTE — Telephone Encounter (Signed)
Receive STD forms and placed in [place] box. 05/11/2020.

## 2020-05-11 NOTE — Telephone Encounter (Signed)
Pt denies any current dizziness/issues. He reports this only happened this morning and then resolved. He reports that he hasn't been sleeping well lately and is experiencing night sweats. Reviewed w/ Dr. Elberta Fortis. Pt advised to follow up w/ PCP to further discuss. Patient verbalized understanding and agreeable to plan.

## 2020-05-11 NOTE — Telephone Encounter (Signed)
STAT if patient feels like he/she is going to faint   1) Are you dizzy now? No   2) Do you feel faint or have you passed out? No   3) Do you have any other symptoms? Night sweats   4) Have you checked your HR and BP (record if available)?   05/11/20: 127/87

## 2020-05-12 DIAGNOSIS — Z6826 Body mass index (BMI) 26.0-26.9, adult: Secondary | ICD-10-CM | POA: Diagnosis not present

## 2020-05-12 DIAGNOSIS — L749 Eccrine sweat disorder, unspecified: Secondary | ICD-10-CM | POA: Diagnosis not present

## 2020-05-13 NOTE — Telephone Encounter (Signed)
Received paperwork back from [facility]. Called patient so he can pick up forms. 05/13/2020.

## 2020-05-28 ENCOUNTER — Other Ambulatory Visit: Payer: Self-pay

## 2020-05-28 ENCOUNTER — Ambulatory Visit (HOSPITAL_COMMUNITY)
Admission: RE | Admit: 2020-05-28 | Discharge: 2020-05-28 | Disposition: A | Discharge status: Home / Self Care | Payer: BC Managed Care – PPO | Source: Ambulatory Visit | Attending: Physician Assistant | Admitting: Physician Assistant

## 2020-05-28 ENCOUNTER — Encounter (HOSPITAL_COMMUNITY): Payer: Self-pay

## 2020-05-28 ENCOUNTER — Encounter (HOSPITAL_COMMUNITY): Payer: Self-pay | Admitting: Physician Assistant

## 2020-05-28 VITALS — BP 156/82 | HR 98 | Ht 69.0 in | Wt 194.8 lb

## 2020-05-28 DIAGNOSIS — I493 Ventricular premature depolarization: Secondary | ICD-10-CM | POA: Insufficient documentation

## 2020-05-28 DIAGNOSIS — F17210 Nicotine dependence, cigarettes, uncomplicated: Secondary | ICD-10-CM | POA: Diagnosis not present

## 2020-05-28 DIAGNOSIS — I491 Atrial premature depolarization: Secondary | ICD-10-CM | POA: Insufficient documentation

## 2020-05-28 DIAGNOSIS — D6869 Other thrombophilia: Secondary | ICD-10-CM | POA: Diagnosis not present

## 2020-05-28 DIAGNOSIS — F1010 Alcohol abuse, uncomplicated: Secondary | ICD-10-CM | POA: Diagnosis not present

## 2020-05-28 DIAGNOSIS — I4819 Other persistent atrial fibrillation: Secondary | ICD-10-CM

## 2020-05-28 DIAGNOSIS — I4892 Unspecified atrial flutter: Secondary | ICD-10-CM | POA: Insufficient documentation

## 2020-05-28 DIAGNOSIS — Z7901 Long term (current) use of anticoagulants: Secondary | ICD-10-CM | POA: Diagnosis not present

## 2020-05-28 NOTE — Progress Notes (Signed)
Primary Care Physician: Patient, [name] (Inactive) Primary Cardiologist: none Primary Electrophysiologist: Dr Elberta Fortis Referring Physician: Theodore Demark PA-C   Patrick Chase is a 35 y.o. male with a history of paroxysmal atrial fibrillation, ulcerative colitis, and HTN who presents for follow up in the [facility].  The patient was initially diagnosed with atrial fibrillation in 2016 after presenting with symptoms of palpitations and presented to ER in afib with RVR. He has done well on BB therapy until recently he has had more episodes of palpitations, particularly worse after his colonoscopy. He does admit to significant alcohol use (6-8 beers daily with more on weekends). He denies snoring. He had recurrence of his rapid afib and underwent TEE guided DCCV on 02/19/20. TEE showed his EF was decreased to 30-35%. Decision was made for ablation given his reduced EF.  On follow up today, patient is s/p afib ablation 04/30/20 with Dr Elberta Fortis. He was also started on flecainide at the time of ablation given his frequent AT and PACs. He reports that he has felt well since his ablation and noted that he has more energy. He stopped metoprolol after his visit 05/08/20 after misunderstanding the instructions. He also ran out of losartan a few days ago and has not been able to get to the pharmacy to pick up his refill.   Today, he denies symptoms of palpitations, chest pain, shortness of breath, orthopnea, PND, lower extremity edema, dizziness, presyncope, syncope, snoring, daytime somnolence, bleeding, or neurologic sequela. The patient is tolerating medications without difficulties and is otherwise without complaint today. +palpitations   Atrial Fibrillation Risk Factors:  he does not have symptoms or diagnosis of sleep apnea. he does not have a history of rheumatic fever. he does have a history of alcohol use. The patient does not have a history of early familial atrial  fibrillation or other arrhythmias.  he has a BMI of Body mass index is 28.77 kg/m.[name] Filed Weights   05/28/20 1408  Weight: 88.4 kg    Family History  Problem Relation Age of Onset  . Hypertension Other      Atrial Fibrillation Management history:  Previous antiarrhythmic drugs: flecainide  Previous cardioversions: 02/19/20 Previous ablations: 05/08/20 CHADS2VASC score: 2 Anticoagulation history: Eliquis   Past Medical History:  Diagnosis Date  . Atrial fibrillation (HCC)   . Hypertension    Past Surgical History:  Procedure Laterality Date  . ATRIAL FIBRILLATION ABLATION N/A 04/30/2020   Procedure: ATRIAL FIBRILLATION ABLATION;  Surgeon: Regan Lemming, MD;  [facility];  Service: Cardiovascular;  Laterality: N/A;  . BIOPSY  08/02/2018   Procedure: BIOPSY;  Surgeon: Malissa Hippo, MD;  [facility];  Service: Endoscopy;;  . CARDIOVERSION N/A 02/19/2020   Procedure: CARDIOVERSION;  Surgeon: Jake Bathe, MD;  Location: [facility] ENDOSCOPY;  Service: Cardiovascular;  Laterality: N/A;  . COLONOSCOPY N/A 08/02/2018   Procedure: COLONOSCOPY;  Surgeon: Malissa Hippo, MD;  [facility];  Service: Endoscopy;  Laterality: N/A;  1230pm  . TEE WITHOUT CARDIOVERSION N/A 02/19/2020   Procedure: TRANSESOPHAGEAL ECHOCARDIOGRAM (TEE);  Surgeon: Jake Bathe, MD;  Location: [facility] ENDOSCOPY;  Service: Cardiovascular;  Laterality: N/A;    Current Outpatient Medications  Medication Sig Dispense Refill  . apixaban (ELIQUIS) 5 MG TABS tablet Take 1 tablet (5 mg total) by mouth 2 (two) times daily. 60 tablet 11  . flecainide (TAMBOCOR) 100 MG tablet Take 1 tablet (100 mg total) by  mouth 2 (two) times daily. 60 tablet 6  . omeprazole (PRILOSEC OTC) 20 MG tablet Take 20 mg by mouth daily.    [name] losartan (COZAAR) 25 MG tablet Take 1 tablet (25 mg total) by mouth daily. (Patient not taking: Reported on 05/28/2020) 30 tablet 2  . metoprolol succinate  (TOPROL-XL) 100 MG 24 hr tablet Take 1 tablet (100 mg total) by mouth at bedtime. Take with or immediately following a meal. (Patient not taking: Reported on 05/28/2020) 90 tablet 3   No current facility-administered medications for this encounter.    No Known Allergies  Social History   Socioeconomic History  . Marital status: Married    Spouse name: Not on file  . Number of children: Not on file  . Years of education: Not on file  . Highest education level: Not on file  Occupational History  . Not on file  Tobacco Use  . Smoking status: Current Some Day Smoker    Packs/day: 0.05    Years: 12.00    Pack years: 0.60    Types: Cigarettes    Start date: 02/21/1998    Last attempt to quit: 07/15/2013    Years since quitting: 6.8  . Smokeless tobacco: Never Used  Substance and Sexual Activity  . Alcohol use: Yes    Alcohol/week: 6.0 - 8.0 standard drinks    Types: 6 - 8 Cans of beer per week  . Drug use: No  . Sexual activity: Not on file  Other Topics Concern  . Not on file  Social History Narrative  . Not on file   Social Determinants of Health   Financial Resource Strain: Not on file  Food Insecurity: Not on file  Transportation Needs: Not on file  Physical Activity: Not on file  Stress: Not on file  Social Connections: Not on file  Intimate Partner Violence: Not on file     ROS- All systems are reviewed and negative except as per the HPI above.  Physical Exam: Vitals:   05/28/20 1408  BP: (!) 156/82  Pulse: 98  Weight: 88.4 kg  Height: 5\' 9"  (1.753 m)    GEN- The patient is a well appearing  male, alert and oriented x 3 today.   HEENT-head normocephalic, atraumatic, sclera clear, conjunctiva pink, hearing intact, trachea midline. Lungs- Clear to ausculation bilaterally, normal work of breathing Heart- Regular rate and rhythm, occasional ectopic beat, no murmurs, rubs or gallops  GI- soft, NT, ND, + BS Extremities- no clubbing, cyanosis, or edema MS- no  significant deformity or atrophy Skin- no rash or lesion Psych- euthymic mood, full affect Neuro- strength and sensation are intact   Wt Readings from Last 3 Encounters:  05/28/20 88.4 kg  05/08/20 89 kg  04/30/20 87.1 kg    EKG today demonstrates  SR, frequent PACs Vent. rate 98 BPM PR interval 126 ms QRS duration 108 ms QT/QTcB 404/515 ms  TEE 02/19/20 1. Left ventricular ejection fraction, by estimation, is 30 to 35%. The  left ventricle has moderately decreased function. The left ventricle  demonstrates global hypokinesis. The left ventricular internal cavity size  was mildly dilated.  2. Right ventricular systolic function is normal. The right ventricular  size is normal.  3. Left atrial size was mildly dilated. No left atrial/left atrial  appendage thrombus was detected.  4. The mitral valve is normal in structure. Mild mitral valve  regurgitation.  5. The aortic valve is normal in structure. Aortic valve regurgitation is  not visualized.  No aortic stenosis is present.   Comparison(s): EF is reduced when compared to prior study.   Conclusion(s)/Recommendation(s): No LA/LAA thrombus identified. Successful  cardioversion performed with restoration of normal sinus rhythm.  Epic records are reviewed at length today  Assessment and Plan:  1. Persistent atrial fibrillation/atrial flutter/atrial tachycardia S/p afib ablation 04/30/20. Multiple atrial tachycardia circuits noted which were not ablated.  Patient still in SR with frequent atrial ectopy.  Resume Toprol 100 mg daily Continue flecainide 100 mg BID Continue Eliquis 5 mg BID with no missed doses for 3 months post ablation. With HTN and systolic CHF his CV score is now 2. If his EF recovers he may be able to d/c anticoagulation. Will defer to Dr Elberta Fortis.   This patients CHA2DS2-VASc Score and unadjusted Ischemic Stroke Rate (% per year) is equal to 2.2 % stroke rate/year from a score of 2  Above score  calculated as 1 point each if present [CHF, HTN, DM, Vascular=MI/PAD/Aortic Plaque, Age if 65-74, or Male] Above score calculated as 2 points each if present [Age > 75, or Stroke/TIA/TE]  2. HTN Elevated today. Patient to resume metoprolol and losartan.   3. EtOH Abuse Encouraged cessation.  4. Systolic dysfunction TEE showed EF 30-35%, suspected tachycardia mediated.  No signs or symptoms of fluid overload. Will likely repeat echo once he has been in SR for several months.    Follow up with Dr Elberta Fortis as scheduled.    [name] PA-C Afib Clinic [facility] [address] [place], [place] [address] [phone] 05/28/2020 2:16 PM

## 2020-07-31 ENCOUNTER — Encounter: Payer: Self-pay | Admitting: Cardiology

## 2020-07-31 ENCOUNTER — Ambulatory Visit: Payer: BC Managed Care – PPO | Admitting: Cardiology

## 2020-07-31 ENCOUNTER — Other Ambulatory Visit: Payer: Self-pay

## 2020-07-31 VITALS — BP 118/80 | HR 92 | Ht 69.0 in | Wt 189.0 lb

## 2020-07-31 DIAGNOSIS — I4819 Other persistent atrial fibrillation: Secondary | ICD-10-CM

## 2020-07-31 NOTE — Patient Instructions (Signed)
Medication Instructions:  Your physician recommends that you continue on your current medications as directed. Please refer to the Current Medication list given to you today.  *If you need a refill on your cardiac medications before your next appointment, please call your pharmacy*   Lab Work: None ordered    Testing/Procedures: Your physician has requested that you have an echocardiogram. Echocardiography is a painless test that uses sound waves to create images of your heart. It provides your doctor with information about the size and shape of your heart and how well your heart's chambers and valves are working. This procedure takes approximately one hour. There are no restrictions for this procedure.    Follow-Up: At [facility], you and your health needs are our priority.  As part of our continuing mission to provide you with exceptional heart care, we have created designated Provider Care Teams.  These Care Teams include your primary Cardiologist (physician) and Advanced Practice Providers (APPs -  Physician Assistants and Nurse Practitioners) who all work together to provide you with the care you need, when you need it.   Your next appointment:   3 month(s)  The format for your next appointment:   In Person  Provider:   Will Camnitz, MD    Thank you for choosing [facility]!!   Danni Shima, RN [phone]   

## 2020-07-31 NOTE — Progress Notes (Signed)
Electrophysiology Office Note   Date:  07/31/2020   ID:  Patrick Chase, DOB [birth year], MRN [id number]  PCP:  Patient, [name] (Inactive)  Cardiologist:  Purvis Sheffield Primary Electrophysiologist:  Aaryn Sermon [name], MD    Chief Complaint: AF   History of Present Illness: Patrick Chase is a 35 y.o. male who is being seen today for the evaluation of AF at the request of No ref. provider found. Presenting today for electrophysiology evaluation.  He has a history of paroxysmal atrial fibrillation, ulcerative colitis, hypertension, and PVCs.  He was diagnosed with atrial fibrillation in 2016.  He was started on beta-blockers at the time.  Drinking 6-8 beers on a daily basis.  He was seen in cardiology clinic 02/02/2020 with palpitations.  He felt that he was in atrial fibrillation for the last 2 to 3 weeks.  He is now status post ablation on 04/30/2020.  He had multiple atrial tachycardia circuits and was started on flecainide.  Today, denies symptoms of palpitations, chest pain, shortness of breath, orthopnea, PND, lower extremity edema, claudication, dizziness, presyncope, syncope, bleeding, or neurologic sequela. The patient is tolerating medications without difficulties.  Since his ablation he has done well.  He has no chest pain or shortness of breath.  Is able do all of his daily activities.  He was put on flecainide after his ablation due to multiple episodes of atrial tachycardia.  He has tolerated this well and feels quite a bit better.  He does continue to have short episodes of atrial tachycardia.   Past Medical History:  Diagnosis Date   Atrial fibrillation (HCC)    Hypertension    Past Surgical History:  Procedure Laterality Date   ATRIAL FIBRILLATION ABLATION N/A 04/30/2020   Procedure: ATRIAL FIBRILLATION ABLATION;  Surgeon: Regan Lemming, MD;  [facility];  Service: Cardiovascular;  Laterality: N/A;   BIOPSY  08/02/2018   Procedure: BIOPSY;   Surgeon: Malissa Hippo, MD;  [facility];  Service: Endoscopy;;   CARDIOVERSION N/A 02/19/2020   Procedure: CARDIOVERSION;  Surgeon: Jake Bathe, MD;  [facility] ENDOSCOPY;  Service: Cardiovascular;  Laterality: N/A;   COLONOSCOPY N/A 08/02/2018   Procedure: COLONOSCOPY;  Surgeon: Malissa Hippo, MD;  [facility];  Service: Endoscopy;  Laterality: N/A;  1230pm   TEE WITHOUT CARDIOVERSION N/A 02/19/2020   Procedure: TRANSESOPHAGEAL ECHOCARDIOGRAM (TEE);  Surgeon: Jake Bathe, MD;  Location: [facility] ENDOSCOPY;  Service: Cardiovascular;  Laterality: N/A;     Current Outpatient Medications  Medication Sig Dispense Refill   apixaban (ELIQUIS) 5 MG TABS tablet Take 1 tablet (5 mg total) by mouth 2 (two) times daily. 60 tablet 11   flecainide (TAMBOCOR) 100 MG tablet Take 1 tablet (100 mg total) by mouth 2 (two) times daily. 60 tablet 6   losartan (COZAAR) 25 MG tablet Take 1 tablet (25 mg total) by mouth daily. (Patient not taking: Reported on 05/28/2020) 30 tablet 2   metoprolol succinate (TOPROL-XL) 100 MG 24 hr tablet Take 1 tablet (100 mg total) by mouth at bedtime. Take with or immediately following a meal. (Patient not taking: Reported on 05/28/2020) 90 tablet 3   omeprazole (PRILOSEC OTC) 20 MG tablet Take 20 mg by mouth daily.     No current facility-administered medications for this visit.    Allergies:   Patient has no known allergies.   Social History:  The patient  reports that he has been smoking cigarettes. He  started smoking about 22 years ago. He has a 0.60 pack-year smoking history. He has never used smokeless tobacco. He reports current alcohol use of about 6.0 - 8.0 standard drinks of alcohol per week. He reports that he does not use drugs.   Family History:  The patient's family history includes Hypertension in an other family member.   ROS:  Please see the history of present illness.   Otherwise, review of systems is positive for none.   All other  systems are reviewed and negative.   PHYSICAL EXAM: VS:  There were no vitals taken for this visit. , BMI There is no height or weight on file to calculate BMI. GEN: Well nourished, well developed, in no acute distress  HEENT: normal  Neck: no JVD, carotid bruits, or masses Cardiac: RRR; no murmurs, rubs, or gallops,no edema  Respiratory:  clear to auscultation bilaterally, normal work of breathing GI: soft, nontender, nondistended, + BS MS: no deformity or atrophy  Skin: warm and dry Neuro:  Strength and sensation are intact Psych: euthymic mood, full affect  EKG:  EKG is ordered today. Personal review of the ekg ordered shows sinus rhythm with episodes of atrial tachycardia, rate 92  Recent Labs: 04/16/2020: Hemoglobin 16.7; Platelets 253 05/08/2020: BUN 15; Creatinine, Ser 0.95; Magnesium 2.2; Potassium 4.7; Sodium 141    Lipid Panel  No results found for: CHOL, TRIG, HDL, CHOLHDL, VLDL, LDLCALC, LDLDIRECT   Wt Readings from Last 3 Encounters:  05/28/20 194 lb 12.8 oz (88.4 kg)  05/08/20 196 lb 3.2 oz (89 kg)  04/30/20 192 lb (87.1 kg)      Other studies Reviewed: Additional studies/ records that were reviewed today include: TEE 02/19/2020 Review of the above records today demonstrates:   1. Left ventricular ejection fraction, by estimation, is 30 to 35%. The  left ventricle has moderately decreased function. The left ventricle  demonstrates global hypokinesis. The left ventricular internal cavity size  was mildly dilated.   2. Right ventricular systolic function is normal. The right ventricular  size is normal.   3. Left atrial size was mildly dilated. No left atrial/left atrial  appendage thrombus was detected.   4. The mitral valve is normal in structure. Mild mitral valve  regurgitation.   5. The aortic valve is normal in structure. Aortic valve regurgitation is  not visualized. No aortic stenosis is present.    Cardiac monitor 12/07/2018 personally  reviewed Max 272 bpm 06:07pm, 09/22 Min 42 bpm 03:45am, 09/22 Avg 85 bpm 254 Ventricular Tachycardia runs occurred, the longest lasting 8.1 secs with an avg rate of 184 bpm. 1406 Supraventricular Tachycardia runs occurred, the longest lasting 2 mins 52 secs with an avg rate of 153 bpm. 10% PVCs, 3% PACs   ASSESSMENT AND PLAN:  1.  Persistent atrial fibrillation/flutter/atrial tachycardia: CHA2DS2-VASc of 0.  Diagnosed 2016.  Normal TSH.  Status post ablation 04/30/2020.  He had multiple atrial tachycardia circuits and was started on flecainide.  High risk medication monitoring.  He feels quite a bit better after ablation and on the flecainide.  We Adiana Smelcer continue with current management.  2.  PVCs: 10% on cardiac monitor.  Continue to monitor.    3.  Alcohol abuse: Cessation encouraged   4.  Systolic heart failure, chronic: Currently on Toprol-XL and Cozaar.  We Bailea Beed plan to repeat his echo now that he is had his ablation performed.  Current medicines are reviewed at length with the patient today.   The patient does not  have concerns regarding his medicines.  The following changes were made today: None  Labs/ tests ordered today include:  No orders of the defined types were placed in this encounter.    Disposition:   FU with Adelae Yodice 3 months  Signed, Javin Nong [name], MD  07/31/2020 11:15 AM     [facility] [address] [place] [address] [phone] (office) [phone] (fax)

## 2020-08-05 ENCOUNTER — Telehealth: Payer: Self-pay | Admitting: Cardiology

## 2020-08-05 DIAGNOSIS — I4891 Unspecified atrial fibrillation: Secondary | ICD-10-CM | POA: Diagnosis not present

## 2020-08-05 DIAGNOSIS — R55 Syncope and collapse: Secondary | ICD-10-CM | POA: Diagnosis not present

## 2020-08-05 NOTE — Telephone Encounter (Signed)
630/700 this morning Felt like he was going to pass out, lasted couple of minutes, diaphoresis & nausea and vomiting followed. He reports that he did NOT eat breakfast and not sure if could be related to that. Aware will have Dr. Elberta Fortis review strip sent and let him know. Patient verbalized understanding and agreeable to plan.  Aware it may be recommended to continue monitoring for now, but will let him know.

## 2020-08-05 NOTE — Telephone Encounter (Signed)
STAT if patient feels like he/she is going to faint   Are you dizzy now? no  Do you feel faint or have you passed out? Did not pass out, but felt close  Do you have any other symptoms? Had nausea  Have you checked your HR and BP (record if available)? States at PCP both were good.    Patient states he has a "weird episode" today around 7am. He states he "almost fell out" and then got very nauseas. He states he did not pass out but got very close to it. He states he is not having symptoms now. He states he did see if PCP today and they said his BP and HR were both good. He states there is a note in his chart of the EKG he had done.

## 2020-08-05 NOTE — Telephone Encounter (Signed)
Spoke with pt who confirms he did have an episode of syncope with nausea this morning.  Pt denies completely passing out but states he felt close.  Pt states he did see his PCP who completed EKG and advised his BP and HR were WNL.  Covid test was negative.  Pt was advised by PCP to go to ED for further evaluation but he declined.  Pt states he feels fine now with exception of still feeling nauseated. Pt advised to continue to monitor his BP and HR.  Continue medications as prescribed.  Hydrate and rest this afternoon.  Reviewed ED precautions.  Will forward information to Dr Elberta Fortis for review and further recommendation.  Pt verbalizes understanding and agrees with current plan.

## 2020-08-06 NOTE — Telephone Encounter (Signed)
Followed up with pt. Pt states no issues today. Got blood work back from PCP and his sugar may have been to low causing incidence yesterday. Pt prefers to monitor for now and will let us know if happens again. He appreciates the follow up

## 2020-08-10 ENCOUNTER — Telehealth: Payer: Self-pay | Admitting: Cardiology

## 2020-08-10 ENCOUNTER — Ambulatory Visit (INDEPENDENT_AMBULATORY_CARE_PROVIDER_SITE_OTHER): Payer: BC Managed Care – PPO

## 2020-08-10 ENCOUNTER — Encounter: Payer: Self-pay | Admitting: *Deleted

## 2020-08-10 DIAGNOSIS — I493 Ventricular premature depolarization: Secondary | ICD-10-CM

## 2020-08-10 DIAGNOSIS — R55 Syncope and collapse: Secondary | ICD-10-CM

## 2020-08-10 NOTE — Telephone Encounter (Signed)
After speaking with Otilio Saber, PA.. I called the pt and advised him that we are ordering a 14 day Zio XT.  He agreed with this plan and understands that if he feels like he needs to be seen to call us back.   I went over brief instructions of the monitor and verified his home address.

## 2020-08-10 NOTE — Telephone Encounter (Signed)
Pt c/o Syncope: STAT if syncope occurred within 30 minutes and pt complains of lightheadedness High Priority if episode of passing out, completely, today or in last 24 hours   Did you pass out today? No- last week   When is the last time you passed out? Last Wednesday   Has this occurred multiple times? no   Did you have any symptoms prior to passing out? Palpitations    Pt had an episode of Syncope last week and his primary doctor wants him to be seen. Pt asked if he could see Otilio Saber today, ifif he have any appointment please.y.s

## 2020-08-10 NOTE — Telephone Encounter (Signed)
PT is needing documentation that he was seen on the 10th and would like to have documentation for fmla to cover him for the days leading up to his appointment on the 8th and the 9th. Please advise.

## 2020-08-10 NOTE — Progress Notes (Unsigned)
Patient enrolled for [employer] to mail a 14 day ZIO XT monitor to address on file. 

## 2020-08-12 NOTE — Telephone Encounter (Signed)
Returned call to pt. Advised to drop off FMLA paperwork, along with small fee, and we will get completed/signed. Pt also asking to 1-2days/month if needed for heart issues.

## 2020-08-14 ENCOUNTER — Ambulatory Visit: Payer: BC Managed Care – PPO | Admitting: Student

## 2020-08-17 DIAGNOSIS — I493 Ventricular premature depolarization: Secondary | ICD-10-CM

## 2020-08-17 DIAGNOSIS — R55 Syncope and collapse: Secondary | ICD-10-CM | POA: Diagnosis not present

## 2020-08-27 ENCOUNTER — Other Ambulatory Visit: Payer: Self-pay | Admitting: Student

## 2020-08-27 ENCOUNTER — Telehealth: Payer: Self-pay | Admitting: Cardiology

## 2020-08-27 DIAGNOSIS — Z0279 Encounter for issue of other medical certificate: Secondary | ICD-10-CM

## 2020-08-27 NOTE — Telephone Encounter (Signed)
Patient came into the office to drop off an FMLA form, make $29 payment, and sign authorization. HIM received the form. The form has been placed in Dr. Elberta Fortis box to be completed.  AO 08/27/20

## 2020-09-03 ENCOUNTER — Telehealth: Payer: Self-pay | Admitting: Cardiology

## 2020-09-03 NOTE — Telephone Encounter (Signed)
Zio from 6/27 to 7/11. SVT at 180 beats per minute, over 60 seconds, on 6/28 8:57 pm. ~23,000 episode of SVT

## 2020-09-03 NOTE — Telephone Encounter (Signed)
Forrwarding to Dr. Elberta Fortis for review of final monitor results.

## 2020-09-03 NOTE — Telephone Encounter (Signed)
[employer] called for an abnormal zio patch

## 2020-09-04 ENCOUNTER — Ambulatory Visit (HOSPITAL_COMMUNITY): Payer: BC Managed Care – PPO | Attending: Cardiovascular Disease

## 2020-09-04 ENCOUNTER — Other Ambulatory Visit: Payer: Self-pay

## 2020-09-04 DIAGNOSIS — I4819 Other persistent atrial fibrillation: Secondary | ICD-10-CM | POA: Diagnosis not present

## 2020-09-04 LAB — ECHOCARDIOGRAM COMPLETE: S' Lateral: 4.4 cm

## 2020-09-10 NOTE — Telephone Encounter (Signed)
HIM received the completed form back from the nurse. The form has been faxed via Epic to [employer] as requested.  AO 09/10/20

## 2020-09-14 ENCOUNTER — Other Ambulatory Visit: Payer: Self-pay | Admitting: Cardiology

## 2020-09-28 DIAGNOSIS — S335XXA Sprain of ligaments of lumbar spine, initial encounter: Secondary | ICD-10-CM | POA: Diagnosis not present

## 2020-09-28 DIAGNOSIS — S233XXA Sprain of ligaments of thoracic spine, initial encounter: Secondary | ICD-10-CM | POA: Diagnosis not present

## 2020-09-28 DIAGNOSIS — S134XXA Sprain of ligaments of cervical spine, initial encounter: Secondary | ICD-10-CM | POA: Diagnosis not present

## 2020-11-10 ENCOUNTER — Encounter: Payer: Self-pay | Admitting: Cardiology

## 2020-11-10 ENCOUNTER — Other Ambulatory Visit: Payer: Self-pay

## 2020-11-10 ENCOUNTER — Ambulatory Visit: Payer: BC Managed Care – PPO | Admitting: Cardiology

## 2020-11-10 VITALS — BP 136/72 | HR 86 | Ht 68.0 in | Wt 183.0 lb

## 2020-11-10 DIAGNOSIS — I4819 Other persistent atrial fibrillation: Secondary | ICD-10-CM

## 2020-11-10 NOTE — Patient Instructions (Signed)
Medication Instructions:  °Your physician recommends that you continue on your current medications as directed. Please refer to the Current Medication list given to you today. ° °*If you need a refill on your cardiac medications before your next appointment, please call your pharmacy* ° ° °Lab Work: °None ordered ° ° °Testing/Procedures: °None ordered ° ° °Follow-Up: °At [facility], you and your health needs are our priority.  As part of our continuing mission to provide you with exceptional heart care, we have created designated Provider Care Teams.  These Care Teams include your primary Cardiologist (physician) and Advanced Practice Providers (APPs -  Physician Assistants and Nurse Practitioners) who all work together to provide you with the care you need, when you need it. ° °Your next appointment:   °6 month(s) ° °The format for your next appointment:   °In Person ° °Provider:   °Will Camnitz, MD ° ° ° °Thank you for choosing [facility]!! ° ° °Adedamola Seto, RN °[phone] °  °

## 2020-11-10 NOTE — Progress Notes (Signed)
Electrophysiology Office Note   Date:  11/10/2020   ID:  Fuller, Makin 27-Jun-1985, MRN [id number]  PCP:  Patient, [name] (Inactive)  Cardiologist:  Purvis Sheffield Primary Electrophysiologist:  Regina Ganci [name], MD    Chief Complaint: AF   History of Present Illness: Patrick Chase is a 35 y.o. male who is being seen today for the evaluation of AF at the request of No ref. provider found. Presenting today for electrophysiology evaluation.  He has a history significant paroxysmal atrial fibrillation, ulcerative colitis, hypertension, and PVCs.  He was diagnosed with atrial fibrillation in 2016.  He was on beta-blockers at that time.  He was drinking 6-8 beers on a daily basis.  He was seen in cardiology clinic 02/02/2020 with palpitations.  He was noted to be in atrial fibrillation for the last 2 to 3 weeks.  He is status post ablation 04/30/2020.  He had multiple atrial tachycardia circuits and was started on flecainide.  Repeat echocardiogram shows that his ejection fraction is normal.  He was previously 30 to 35%.  Today, denies symptoms of palpitations, chest pain, shortness of breath, orthopnea, PND, lower extremity edema, claudication, dizziness, presyncope, syncope, bleeding, or neurologic sequela. The patient is tolerating medications without difficulties.  He currently feels well.  He does state that when he has too much to drink, he notes more frequent palpitations.  When he is doing things that he usually does, palpitations go away.  He is overall comfortable with his medications.  He states that if he has quite a few palpitations, he takes an x-ray dose of flecainide.   Past Medical History:  Diagnosis Date   Atrial fibrillation (HCC)    GERD (gastroesophageal reflux disease)    Hypertension    Past Surgical History:  Procedure Laterality Date   ATRIAL FIBRILLATION ABLATION N/A 04/30/2020   Procedure: ATRIAL FIBRILLATION ABLATION;  Surgeon: Regan Lemming, MD;   [facility]: [facility];  Service: Cardiovascular;  Laterality: N/A;   BIOPSY  08/02/2018   Procedure: BIOPSY;  Surgeon: Malissa Hippo, MD;  Location: [facility];  Service: Endoscopy;;   CARDIOVERSION N/A 02/19/2020   Procedure: CARDIOVERSION;  Surgeon: Jake Bathe, MD;  Location: [facility] ENDOSCOPY;  Service: Cardiovascular;  Laterality: N/A;   COLONOSCOPY N/A 08/02/2018   Procedure: COLONOSCOPY;  Surgeon: Malissa Hippo, MD;  Location: [facility];  Service: Endoscopy;  Laterality: N/A;  1230pm   TEE WITHOUT CARDIOVERSION N/A 02/19/2020   Procedure: TRANSESOPHAGEAL ECHOCARDIOGRAM (TEE);  Surgeon: Jake Bathe, MD;  Location: [facility] ENDOSCOPY;  Service: Cardiovascular;  Laterality: N/A;     Current Outpatient Medications  Medication Sig Dispense Refill   flecainide (TAMBOCOR) 100 MG tablet Take 1 tablet (100 mg total) by mouth 2 (two) times daily. 60 tablet 6   losartan (COZAAR) 25 MG tablet TAKE ONE TABLET (25 MG TOTAL) BY MOUTH ONCE DAILY. 30 tablet 2   metoprolol succinate (TOPROL-XL) 100 MG 24 hr tablet Take 1 tablet (100 mg total) by mouth at bedtime. Take with or immediately following a meal. 90 tablet 3   omeprazole (PRILOSEC OTC) 20 MG tablet Take 20 mg by mouth daily.     apixaban (ELIQUIS) 5 MG TABS tablet Take 1 tablet (5 mg total) by mouth 2 (two) times daily. (Patient not taking: Reported on 11/10/2020) 60 tablet 11   No current facility-administered medications for this visit.    Allergies:   Patient has no known allergies.   Social  History:  The patient  reports that he has been smoking cigarettes. He started smoking about 22 years ago. He has a 0.60 pack-year smoking history. He has never used smokeless tobacco. He reports current alcohol use of about 6.0 - 8.0 standard drinks per week. He reports that he does not use drugs.   Family History:  The patient's family history includes Hypertension in an other family member.   ROS:  Please see the history of present  illness.   Otherwise, review of systems is positive for none.   All other systems are reviewed and negative.   PHYSICAL EXAM: VS:  BP 136/72   Pulse 86   Ht 5\' 8"  (1.727 m)   Wt 183 lb (83 kg)   SpO2 98%   BMI 27.83 kg/m  , BMI Body mass index is 27.83 kg/m. GEN: Well nourished, well developed, in no acute distress  HEENT: normal  Neck: no JVD, carotid bruits, or masses Cardiac: RRR; no murmurs, rubs, or gallops,no edema  Respiratory:  clear to auscultation bilaterally, normal work of breathing GI: soft, nontender, nondistended, + BS MS: no deformity or atrophy  Skin: warm and dry Neuro:  Strength and sensation are intact Psych: euthymic mood, full affect  EKG:  EKG is ordered today. Personal review of the ekg ordered shows sinus rhythm, PACs and PVCs, rate 86  Recent Labs: 04/16/2020: Hemoglobin 16.7; Platelets 253 05/08/2020: BUN 15; Creatinine, Ser 0.95; Magnesium 2.2; Potassium 4.7; Sodium 141    Lipid Panel  No results found for: CHOL, TRIG, HDL, CHOLHDL, VLDL, LDLCALC, LDLDIRECT   Wt Readings from Last 3 Encounters:  11/10/20 183 lb (83 kg)  07/31/20 189 lb (85.7 kg)  05/28/20 194 lb 12.8 oz (88.4 kg)      Other studies Reviewed: Additional studies/ records that were reviewed today include: TEE 09/04/2020 Review of the above records today demonstrates:   1. Left ventricular ejection fraction, by estimation, is 50 to 55%. The  left ventricle has low normal function. The left ventricle has no regional  wall motion abnormalities. Left ventricular diastolic parameters are  indeterminate.   2. Right ventricular systolic function is normal. The right ventricular  size is normal.   3. The mitral valve is normal in structure. Trivial mitral valve  regurgitation.   4. The aortic valve is normal in structure. Aortic valve regurgitation is  not visualized. No aortic stenosis is present.    Cardiac monitor 12/07/2018 personally reviewed Max 272 bpm 06:07pm, 09/22 Min  42 bpm 03:45am, 09/22 Avg 85 bpm 254 Ventricular Tachycardia runs occurred, the longest lasting 8.1 secs with an avg rate of 184 bpm. 1406 Supraventricular Tachycardia runs occurred, the longest lasting 2 mins 52 secs with an avg rate of 153 bpm. 10% PVCs, 3% PACs   ASSESSMENT AND PLAN:  1.  Persistent atrial fibrillation/atrial tachycardia: CHA2DS2-VASc of 0.  Was diagnosed in 2016.  Is status post ablation 04/30/2020.  He had multiple atrial tachycardia circuits and is now on flecainide.  High risk medication monitoring via ECG.  He continues to have episodes of PACs and PVCs.  He is minimally symptomatic.  Fortunately his ejection fraction has normalized.  We Aizlyn Schifano continue with current management.  2.  PVCs: 10% on cardiac monitor.  Continue to monitor.  3.  Alcohol abuse: Cessation encouraged  4.  Chronic systolic heart failure: Continue currently on Toprol-XL and Cozaar.  No obvious volume overload.  Ejection fraction has come up to 50 to 55%.  We Janzen Sacks  continue with current management.  Current medicines are reviewed at length with the patient today.   The patient does not have concerns regarding his medicines.  The following changes were made today: None  Labs/ tests ordered today include:  Orders Placed This Encounter  Procedures   EKG 12-Lead      Disposition:   FU with Lajoy Vanamburg 6 large months  Signed, Tayton Decaire [name], MD  11/10/2020 2:50 PM     [facility] [address] [place] [address] [phone] (office) [phone] (fax)

## 2021-01-11 ENCOUNTER — Telehealth: Payer: Self-pay | Admitting: Cardiology

## 2021-01-11 NOTE — Telephone Encounter (Signed)
Patient calling to see if it is too late to schedule an ablation before the end of the year for insurance purposes.

## 2021-01-11 NOTE — Telephone Encounter (Signed)
Advised the patient that Dr. Elberta Fortis is past the end of the year with scheduling. Patient stated he is just trying to think of the best way to get off some of the medications. He has a follow up appointment in March and with discuss with Dr. Elberta Fortis then.

## 2021-03-12 DIAGNOSIS — F17210 Nicotine dependence, cigarettes, uncomplicated: Secondary | ICD-10-CM | POA: Diagnosis not present

## 2021-03-12 DIAGNOSIS — R55 Syncope and collapse: Secondary | ICD-10-CM | POA: Diagnosis not present

## 2021-03-12 DIAGNOSIS — Z23 Encounter for immunization: Secondary | ICD-10-CM | POA: Diagnosis not present

## 2021-03-12 DIAGNOSIS — I10 Essential (primary) hypertension: Secondary | ICD-10-CM | POA: Diagnosis not present

## 2021-03-12 DIAGNOSIS — E782 Mixed hyperlipidemia: Secondary | ICD-10-CM | POA: Diagnosis not present

## 2021-03-12 DIAGNOSIS — Z0000 Encounter for general adult medical examination without abnormal findings: Secondary | ICD-10-CM | POA: Diagnosis not present

## 2021-03-12 DIAGNOSIS — I480 Paroxysmal atrial fibrillation: Secondary | ICD-10-CM | POA: Diagnosis not present

## 2021-03-12 DIAGNOSIS — I4891 Unspecified atrial fibrillation: Secondary | ICD-10-CM | POA: Diagnosis not present

## 2021-03-23 ENCOUNTER — Other Ambulatory Visit: Payer: Self-pay | Admitting: Student

## 2021-05-07 ENCOUNTER — Ambulatory Visit: Payer: BC Managed Care – PPO | Admitting: Cardiology

## 2021-05-24 ENCOUNTER — Ambulatory Visit: Payer: BC Managed Care – PPO | Admitting: Cardiology

## 2021-09-08 ENCOUNTER — Other Ambulatory Visit: Payer: Self-pay | Admitting: Student

## 2021-11-30 DIAGNOSIS — Z2882 Immunization not carried out because of caregiver refusal: Secondary | ICD-10-CM | POA: Diagnosis not present

## 2021-12-06 NOTE — Progress Notes (Signed)
PCP:  Patient, [name] Primary Cardiologist: None Electrophysiologist: Patrick Meredith Leeds, MD   Patrick Chase is a 36 y.o. male seen today for Patrick Meredith Leeds, MD for routine electrophysiology followup. Since last being seen in our clinic the patient reports doing OK. He has mild palpitations from time to time, but otherwise is feeling fine without complaint. .  he denies chest pain, dyspnea, PND, orthopnea, nausea, vomiting, dizziness, syncope, edema, weight gain, or early satiety. He occasionally feels a "tightness" in his R leg as if it is swollen, but remains active and has not had any significant edema.   Past Medical History:  Diagnosis Date   Atrial fibrillation ([place])    GERD (gastroesophageal reflux disease)    Hypertension    Past Surgical History:  Procedure Laterality Date   ATRIAL FIBRILLATION ABLATION N/A 04/30/2020   Procedure: ATRIAL FIBRILLATION ABLATION;  Surgeon: Constance Haw, MD;  Location: [place] CV LAB;  Service: Cardiovascular;  Laterality: N/A;   BIOPSY  08/02/2018   Procedure: BIOPSY;  Surgeon: Rogene Houston, MD;  [facility];  Service: Endoscopy;;   CARDIOVERSION N/A 02/19/2020   Procedure: CARDIOVERSION;  Surgeon: Jerline Pain, MD;  Location: [facility] ENDOSCOPY;  Service: Cardiovascular;  Laterality: N/A;   COLONOSCOPY N/A 08/02/2018   Procedure: COLONOSCOPY;  Surgeon: Rogene Houston, MD;  [facility];  Service: Endoscopy;  Laterality: N/A;  1230pm   TEE WITHOUT CARDIOVERSION N/A 02/19/2020   Procedure: TRANSESOPHAGEAL ECHOCARDIOGRAM (TEE);  Surgeon: Jerline Pain, MD;  Location: [facility] ENDOSCOPY;  Service: Cardiovascular;  Laterality: N/A;    Current Outpatient Medications  Medication Sig Dispense Refill   flecainide (TAMBOCOR) 100 MG tablet TAKE ONE TABLET (100 MG TOTAL) BY MOUTH TWO TIMES DAILY. 60 tablet 2   losartan (COZAAR) 25 MG tablet TAKE ONE TABLET (25 MG TOTAL) BY MOUTH ONCE DAILY. 30 tablet 2   metoprolol  succinate (TOPROL-XL) 100 MG 24 hr tablet TAKE ONE TABLET BY MOUTH EVERY NIGHT AT BEDTIME WITH OR IMMEDIATELY FOLLOWINGA MEAL 90 tablet 3   omeprazole (PRILOSEC OTC) 20 MG tablet Take 20 mg by mouth daily.     apixaban (ELIQUIS) 5 MG TABS tablet Take 1 tablet (5 mg total) by mouth 2 (two) times daily. (Patient not taking: Reported on 12/13/2021) 60 tablet 11   No current facility-administered medications for this visit.    No Known Allergies  Social History   Socioeconomic History   Marital status: Married    Spouse name: Not on file   Number of children: Not on file   Years of education: Not on file   Highest education level: Not on file  Occupational History   Not on file  Tobacco Use   Smoking status: Some Days    Packs/day: 0.05    Years: 12.00    Total pack years: 0.60    Types: Cigarettes    Start date: 02/21/1998    Last attempt to quit: 07/15/2013    Years since quitting: 8.4   Smokeless tobacco: Never  Substance and Sexual Activity   Alcohol use: Yes    Alcohol/week: 6.0 - 8.0 standard drinks of alcohol    Types: 6 - 8 Cans of beer per week   Drug use: No   Sexual activity: Not on file  Other Topics Concern   Not on file  Social History Narrative   Not on file   Social Determinants of Health   Financial Resource Strain: Not  on file  Food Insecurity: Not on file  Transportation Needs: Not on file  Physical Activity: Not on file  Stress: Not on file  Social Connections: Not on file  Intimate Partner Violence: Not on file     Review of Systems: All other systems reviewed and are otherwise negative except as noted above.  Physical Exam: Vitals:   12/13/21 1208  BP: (!) 120/90  Pulse: (!) 110  SpO2: 97%  Weight: 186 lb (84.4 kg)  Height: 5\' 9"  (1.753 m)    GEN- The patient is well appearing, alert and oriented x 3 today.   HEENT: normocephalic, atraumatic; sclera clear, conjunctiva pink; hearing intact; oropharynx clear; neck supple, no JVP Lymph-  no cervical lymphadenopathy Lungs- Clear to ausculation bilaterally, normal work of breathing.  No wheezes, rales, rhonchi Heart-  Somewhat irregular  rate and rhythm, no murmurs, rubs or gallops, PMI not laterally displaced GI- soft, non-tender, non-distended, bowel sounds present, no hepatosplenomegaly Extremities- No peripheral edema. no clubbing or cyanosis; DP/PT/radial pulses 2+ bilaterally MS- no significant deformity or atrophy Skin- warm and dry, no rash or lesion Psych- euthymic mood, full affect Neuro- strength and sensation are intact  EKG is ordered. Personal review of EKG from today shows NSR with brief bursts of SVT vs very frequent PACs  Additional studies reviewed include: Previous EP office notes.   Assessment and Plan:  1.  Persistent atrial fibrillation/atrial tachycardia:  CHA2DS2-VASc of 0.  Was diagnosed in 2016.  Is status post ablation 04/30/2020.  He had multiple atrial tachycardia circuits and is now on flecainide.   He continues to have bursts of SVT on EKG today Increase flecainide to 150 mg BID. EKG 10-14 days. Consider monitoring vs ETT if continues to have bursts of SVT.    2.  PVCs:  1% on flecainide    3.  Alcohol abuse: Cessation encouraged   4.  Chronic systolic heart failure: Continue currently on Toprol-XL and Cozaar.   EF normalized by echo 08/2020 to 50-55%   Follow up with Dr. Curt Bears in 6 months  Shirley Friar, PA-C  12/13/21 12:13 PM

## 2021-12-13 ENCOUNTER — Encounter: Payer: Self-pay | Admitting: Student

## 2021-12-13 ENCOUNTER — Ambulatory Visit: Payer: BC Managed Care – PPO | Attending: Student | Admitting: Student

## 2021-12-13 VITALS — BP 120/90 | HR 110 | Ht 69.0 in | Wt 186.0 lb

## 2021-12-13 DIAGNOSIS — I493 Ventricular premature depolarization: Secondary | ICD-10-CM | POA: Diagnosis not present

## 2021-12-13 DIAGNOSIS — I4819 Other persistent atrial fibrillation: Secondary | ICD-10-CM | POA: Diagnosis not present

## 2021-12-13 DIAGNOSIS — I10 Essential (primary) hypertension: Secondary | ICD-10-CM | POA: Diagnosis not present

## 2021-12-13 DIAGNOSIS — D6869 Other thrombophilia: Secondary | ICD-10-CM | POA: Diagnosis not present

## 2021-12-13 MED ORDER — FLECAINIDE ACETATE 150 MG PO TABS: 150.0000 mg | ORAL_TABLET | Freq: Two times a day (BID) | ORAL | 3 refills | Status: DC

## 2021-12-13 NOTE — Patient Instructions (Addendum)
Medication Instructions:  Your physician has recommended you make the following change in your medication:   INCREASE: Flecainide to 150mg  twice daily  *If you need a refill on your cardiac medications before your next appointment, please call your pharmacy*   Lab Work: None If you have labs (blood work) drawn today and your tests are completely normal, you will receive your results only by: [place] (if you have MyChart) OR A paper copy in the mail If you have any lab test that is abnormal or we need to change your treatment, we will call you to review the results.   Follow-Up: At [facility], you and your health needs are our priority.  As part of our continuing mission to provide you with exceptional heart care, we have created designated Provider Care Teams.  These Care Teams include your primary Cardiologist (physician) and Advanced Practice Providers (APPs -  Physician Assistants and Nurse Practitioners) who all work together to provide you with the care you need, when you need it.  We recommend signing up for the patient portal called "MyChart".  Sign up information is provided on this After Visit Summary.  MyChart is used to connect with patients for Virtual Visits (Telemedicine).  Patients are able to view lab/test results, encounter notes, upcoming appointments, etc.  Non-urgent messages can be sent to your provider as well.   To learn more about what you can do with MyChart, go to [email or web address].    Your next appointment:   As scheduled  Important Information About Sugar

## 2021-12-23 ENCOUNTER — Ambulatory Visit: Payer: BC Managed Care – PPO

## 2021-12-31 ENCOUNTER — Ambulatory Visit: Payer: BC Managed Care – PPO | Attending: Interventional Cardiology | Admitting: *Deleted

## 2021-12-31 VITALS — BP 120/80 | HR 72 | Ht 69.0 in | Wt 186.0 lb

## 2021-12-31 DIAGNOSIS — I4819 Other persistent atrial fibrillation: Secondary | ICD-10-CM | POA: Diagnosis not present

## 2021-12-31 DIAGNOSIS — I493 Ventricular premature depolarization: Secondary | ICD-10-CM

## 2021-12-31 NOTE — Progress Notes (Addendum)
   Nurse Visit   Date of Encounter: 12/31/2021 ID: Patrick Chase, DOB 12-09-1985, MRN [id number]  PCP:  Patient, [name]   [facility] Providers Cardiologist:  None Electrophysiologist:  Will [name], MD      Visit Details   VS:  BP 120/80   Pulse 72   Ht 5\' 9"  (1.753 m)   Wt 186 lb (84.4 kg)   BMI 27.47 kg/m  , BMI Body mass index is 27.47 kg/m.  Wt Readings from Last 3 Encounters:  12/31/21 186 lb (84.4 kg)  12/13/21 186 lb (84.4 kg)  11/10/20 183 lb (83 kg)     Reason for visit: EKG visit for recent increased Flecainide to 150 mg bid on 12/13/21 by 12/15/21 PA-C Performed today: Vitals, EKG, Provider consulted:DOD Dr. Otilio Saber , and Education got a long rhythm strip which showed some SVT, PVCs, couplets, triplets, but mostly sinus rhythm on his Flecainide 150 mg BID. Pt advised to continue the doses but take them 12 hours apart and no exercise until Eldridge Dace PA-C or Dr. Otilio Saber decide on an ETT or monitor.  Place EKG strips in Dr. Elberta Fortis mailbox for review when he returns to the office next week.   Changes (medications, testing, etc.) : Pt was taking his flecainide 150 mg po at 7 am and 2nd dose at 9 pm, everyday.  He was advised that he needs to take his flecainide 150 mg by mouth bid and have this 12 hrs apart.  He was also advised to avoid any exercise or strenuous activity until otherwise advised by Elberta Fortis or Dr. Mardelle Matte.  He is aware that Dr. Elberta Fortis RN will touch base with him next week, upon return to the office, with a further plan and/or if additional testing is needed.   Length of Visit: 60 minutes    Medications Adjustments/Labs and Tests Ordered: Orders Placed This Encounter  Procedures   EKG 12-Lead   No orders of the defined types were placed in this encounter.    Elberta Fortis, LPN  [place] Dunk 12:23 PM  I reviewed his ECG today in the office as DOD.  Long rhythm strip which showed some SVT, PVCs, couplets, triplets,  but mostly sinus rhythm on his Flecainide 150 mg BID. I told 41/[id number]/7867 to tell him to continue the doses but take them 12 hours apart and no exercise until EP decided on an ETT or monitor , as noted in their last note. Rhythm strip will be in Dr. Lajoyce Corners folder.   Elberta Fortis, MD

## 2021-12-31 NOTE — Patient Instructions (Addendum)
Medication Instructions:   CONTINUE YOUR FLECAINIDE 150 MG TWICE DAILY--MAKE SURE YOU TAKE THEM 12 HOURS APART  *If you need a refill on your cardiac medications before your next appointment, please call your pharmacy*    Follow-Up:  AS PLANNED WITH DR CAMNITZ--DR. CAMNITZ RN WILL TOUCH BASE WITH YOU SOON WITH A FURTHER PLAN AND IF YOUR FOLLOW-UP NEEDS TO BE MOVED UP TO A SOONER TIME FRAME   Other Instructions  AVOID EXERCISING OR ANY STRENUOUS ACTIVITY UNTIL OTHERWISE ADVISED  Important Information About Sugar

## 2022-01-24 ENCOUNTER — Telehealth: Payer: Self-pay | Admitting: *Deleted

## 2022-01-24 NOTE — Telephone Encounter (Signed)
Finally reached pt to follow up and see how he is doing after medication increase. Reports he is doing well, feeling good. Advised to continue current tx plan and keep follow up next year. Patient verbalized understanding and agreeable to plan.

## 2022-03-02 DIAGNOSIS — J209 Acute bronchitis, unspecified: Secondary | ICD-10-CM | POA: Diagnosis not present

## 2022-03-02 DIAGNOSIS — J189 Pneumonia, unspecified organism: Secondary | ICD-10-CM | POA: Diagnosis not present

## 2022-03-02 DIAGNOSIS — I4891 Unspecified atrial fibrillation: Secondary | ICD-10-CM | POA: Diagnosis not present

## 2022-03-02 DIAGNOSIS — J069 Acute upper respiratory infection, unspecified: Secondary | ICD-10-CM | POA: Diagnosis not present

## 2022-03-21 DIAGNOSIS — M531 Cervicobrachial syndrome: Secondary | ICD-10-CM | POA: Diagnosis not present

## 2022-03-21 DIAGNOSIS — S134XXA Sprain of ligaments of cervical spine, initial encounter: Secondary | ICD-10-CM | POA: Diagnosis not present

## 2022-03-21 DIAGNOSIS — S233XXA Sprain of ligaments of thoracic spine, initial encounter: Secondary | ICD-10-CM | POA: Diagnosis not present

## 2022-03-21 DIAGNOSIS — S335XXA Sprain of ligaments of lumbar spine, initial encounter: Secondary | ICD-10-CM | POA: Diagnosis not present

## 2022-04-13 DIAGNOSIS — M531 Cervicobrachial syndrome: Secondary | ICD-10-CM | POA: Diagnosis not present

## 2022-04-13 DIAGNOSIS — S233XXA Sprain of ligaments of thoracic spine, initial encounter: Secondary | ICD-10-CM | POA: Diagnosis not present

## 2022-04-13 DIAGNOSIS — S335XXA Sprain of ligaments of lumbar spine, initial encounter: Secondary | ICD-10-CM | POA: Diagnosis not present

## 2022-04-13 DIAGNOSIS — S134XXA Sprain of ligaments of cervical spine, initial encounter: Secondary | ICD-10-CM | POA: Diagnosis not present

## 2022-04-25 DIAGNOSIS — S233XXA Sprain of ligaments of thoracic spine, initial encounter: Secondary | ICD-10-CM | POA: Diagnosis not present

## 2022-04-25 DIAGNOSIS — M531 Cervicobrachial syndrome: Secondary | ICD-10-CM | POA: Diagnosis not present

## 2022-04-25 DIAGNOSIS — S335XXA Sprain of ligaments of lumbar spine, initial encounter: Secondary | ICD-10-CM | POA: Diagnosis not present

## 2022-04-25 DIAGNOSIS — S134XXA Sprain of ligaments of cervical spine, initial encounter: Secondary | ICD-10-CM | POA: Diagnosis not present

## 2022-05-31 ENCOUNTER — Other Ambulatory Visit: Payer: Self-pay | Admitting: Cardiology

## 2022-12-26 ENCOUNTER — Other Ambulatory Visit: Payer: Self-pay

## 2022-12-26 ENCOUNTER — Telehealth: Payer: Self-pay | Admitting: Cardiology

## 2022-12-26 MED ORDER — FLECAINIDE ACETATE 150 MG PO TABS
150.0000 mg | ORAL_TABLET | Freq: Two times a day (BID) | ORAL | 0 refills | Status: DC
Start: 2022-12-26 — End: 2023-01-13

## 2022-12-26 MED ORDER — METOPROLOL SUCCINATE ER 100 MG PO TB24
100.0000 mg | ORAL_TABLET | Freq: Every day | ORAL | 0 refills | Status: DC
Start: 2022-12-26 — End: 2023-01-13

## 2022-12-26 NOTE — Telephone Encounter (Signed)
*  STAT* If patient is at the pharmacy, call can be transferred to refill team.   1. Which medications need to be refilled? (please list name of each medication and dose if known)   flecainide (TAMBOCOR) 150 MG tablet    metoprolol succinate (TOPROL-XL) 100 MG 24 hr tablet    2. Which pharmacy/location (including street and city if local pharmacy) is medication to be sent to?  [facility] - [place], [place] - [address]      3. Do they need a 30 day or 90 day supply? 90 day   Pt is out of medication.

## 2023-01-12 ENCOUNTER — Other Ambulatory Visit: Payer: Self-pay | Admitting: Cardiology

## 2023-01-13 ENCOUNTER — Other Ambulatory Visit: Payer: Self-pay | Admitting: Cardiology

## 2023-01-16 NOTE — Progress Notes (Unsigned)
  Electrophysiology Office Note:   Date:  01/17/2023  ID:  BRYTEN SHURTS, DOB 04-Oct-1985, MRN [id number]  Primary Cardiologist: None Electrophysiologist: Will [name], MD      History of Present Illness:   Patrick Chase is a 37 y.o. male with h/o AF, SVT/AT, GERD, and HTN seen today for routine electrophysiology followup.   Since last being seen in our clinic the patient reports doing OK overall. Still has palpitations from time to time. Had COVID about 2 months ago and has still been having on and off dyspnea. Describes it as less SOB with exertion, and more difficulty taking a full, deep breath. Denies chest pain. No syncope or edema. He does feel anxiety could also be playing a part.  Review of systems complete and found to be negative unless listed in HPI.   EP Information / Studies Reviewed:    EKG is ordered today. Personal review as below.  EKG Interpretation Date/Time:  Tuesday January 17 2023 11:38:45 EST Ventricular Rate:  107 PR Interval:    QRS Duration:  106 QT Interval:  394 QTC Calculation: 525 R Axis:   -62  Text Interpretation: Sinus tachycardia with 2nd degree A-V block (Mobitz I) with occasional and consecutive Premature ventricular complexes Left axis deviation Minimal voltage criteria for LVH, may be normal variant ( Cornell product ) Prolonged QT Confirmed by Maxine Glenn [phone]) on 01/17/2023 11:46:04 AM    Arrhythmia History  S/p AF ablation 04/2020 with multiple AT circuits  Physical Exam:   VS:  BP 116/88   Pulse (!) 107   Ht 5\' 9"  (1.753 m)   Wt 199 lb 6.4 oz (90.4 kg)   SpO2 97%   BMI 29.45 kg/m    Wt Readings from Last 3 Encounters:  01/17/23 199 lb 6.4 oz (90.4 kg)  12/31/21 186 lb (84.4 kg)  12/13/21 186 lb (84.4 kg)     GEN: Well nourished, well developed in no acute distress NECK: No JVD; No carotid bruits CARDIAC: Regular rate and rhythm, no murmurs, rubs, gallops RESPIRATORY:  Clear to auscultation without rales, wheezing  or rhonchi  ABDOMEN: Soft, non-tender, non-distended EXTREMITIES:  No edema; No deformity   ASSESSMENT AND PLAN:    Persistent atrial fibrillation/atrial tachycardia:  S/p ablation 04/2020  Not on Eliquis currently with CHA2DS2-VASc of 0.  He has had multiple atrial tachycardia circuits and is now on flecainide.   Continue flecainide 150 mg BID. Consider monitoring vs ETT if continues to have bursts of SVT.    PVCs:  1% on flecainide previosuly   Alcohol abuse: Cessation encouraged   HFrecEF  Continue currently on Toprol-XL and Cozaar.   EF normalized by echo 08/2020 to 50-55%   Dyspnea Multifactorial.  As he does still have PVCs and bursts of AT on EKG today, will order ETT.     Follow up with Dr. Elberta Fortis in 6 months  Signed, Graciella Freer, PA-C

## 2023-01-17 ENCOUNTER — Encounter: Payer: Self-pay | Admitting: Student

## 2023-01-17 ENCOUNTER — Ambulatory Visit: Payer: BC Managed Care – PPO | Attending: Student | Admitting: Student

## 2023-01-17 VITALS — BP 116/88 | HR 107 | Ht 69.0 in | Wt 199.4 lb

## 2023-01-17 DIAGNOSIS — I10 Essential (primary) hypertension: Secondary | ICD-10-CM | POA: Diagnosis not present

## 2023-01-17 DIAGNOSIS — I4819 Other persistent atrial fibrillation: Secondary | ICD-10-CM

## 2023-01-17 DIAGNOSIS — I493 Ventricular premature depolarization: Secondary | ICD-10-CM | POA: Diagnosis not present

## 2023-01-17 DIAGNOSIS — R0600 Dyspnea, unspecified: Secondary | ICD-10-CM | POA: Diagnosis not present

## 2023-01-17 NOTE — Patient Instructions (Signed)
Medication Instructions:  Your physician recommends that you continue on your current medications as directed. Please refer to the Current Medication list given to you today.  *If you need a refill on your cardiac medications before your next appointment, please call your pharmacy*  Lab Work: None ordered If you have labs (blood work) drawn today and your tests are completely normal, you will receive your results only by: MyChart Message (if you have MyChart) OR A paper copy in the mail If you have any lab test that is abnormal or we need to change your treatment, we will call you to review the results.  Testing/Procedures: Your physician has requested that you have an exercise tolerance test. For further information please visit [email or web address] Please also follow instruction sheet, as given.    Follow-Up: At [facility], you and your health needs are our priority.  As part of our continuing mission to provide you with exceptional heart care, we have created designated Provider Care Teams.  These Care Teams include your primary Cardiologist (physician) and Advanced Practice Providers (APPs -  Physician Assistants and Nurse Practitioners) who all work together to provide you with the care you need, when you need it.  Your next appointment:   6 month(s)  Provider:   Loman Brooklyn, MD

## 2023-02-02 ENCOUNTER — Ambulatory Visit: Payer: BC Managed Care – PPO | Attending: Student

## 2023-02-02 DIAGNOSIS — B349 Viral infection, unspecified: Secondary | ICD-10-CM | POA: Diagnosis not present

## 2023-02-25 IMAGING — CT CT HEART MORPH/PULM VEIN W/ CM & W/O CA SCORE
2 of 5 series · 11 of 20 positions shown, 13 images · non-contrast
Comparison: None.
COMPARISON: None.

Addendum:
EXAM:
OVER-READ INTERPRETATION  CT CHEST

The following report is an over-read performed by radiologist Dr.
Naqiah Mud [REDACTED] on 04/23/2020. This
over-read does not include interpretation of cardiac or coronary
anatomy or pathology. The coronary calcium score/coronary CTA
interpretation by the cardiologist is attached.
CLINICAL DATA: 34M with hypertension, PVCs and atrial fibrillation
scheduled for an ablation.
Cardiac CT/CTA
TECHNIQUE: The patient was scanned on a Siemens Somatom scanner.

[Series 6: pv delay · axial · portal-venous · 0.39mm/px · z∈[-142,-101]mm · 3 of 167 slices shown]
[im 42/167  vessel]
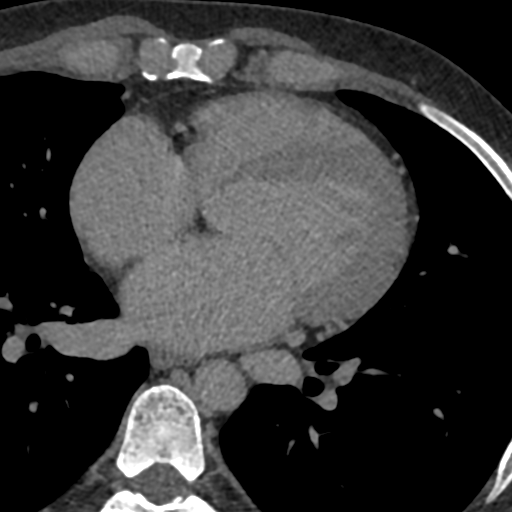
[im 84/167  vessel]
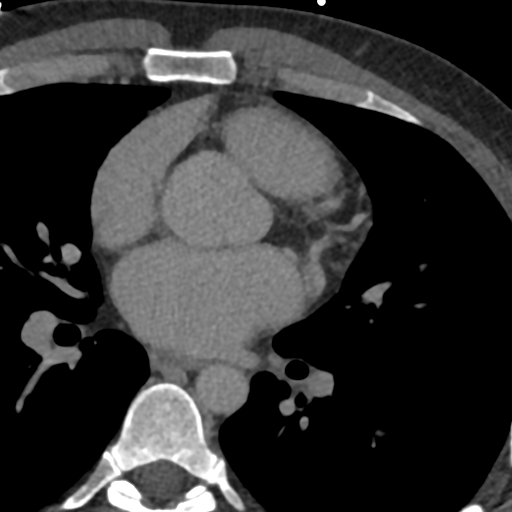
[im 125/167  vessel]
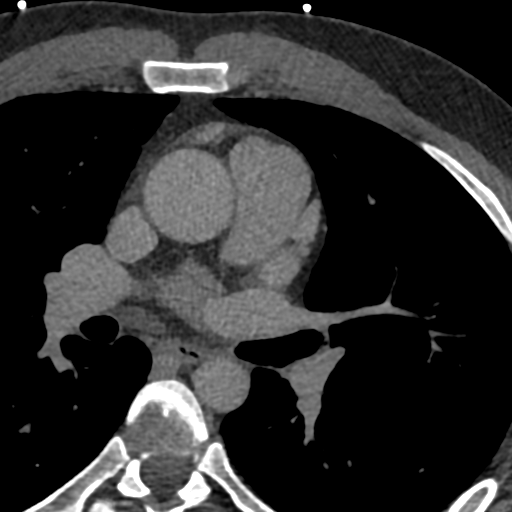

[Series 7: best syst · axial · 0.39mm/px · z∈[-217,-97]mm · 8 of 386 slices shown, 10 images]
[im 43/386  vessel]
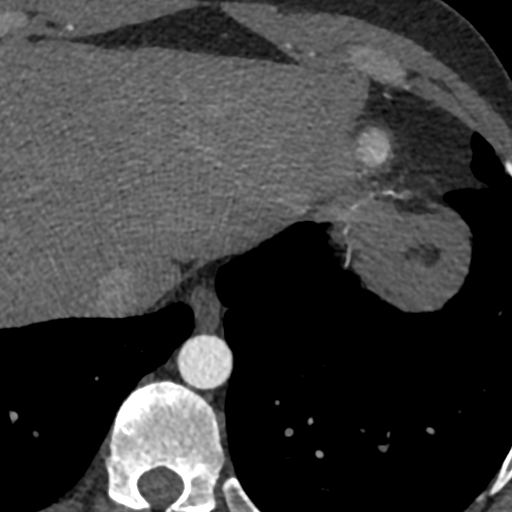
[im 43/386  lung]
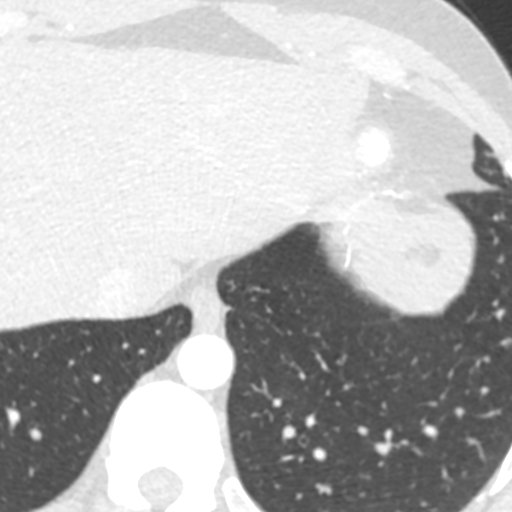
[im 86/386  vessel]
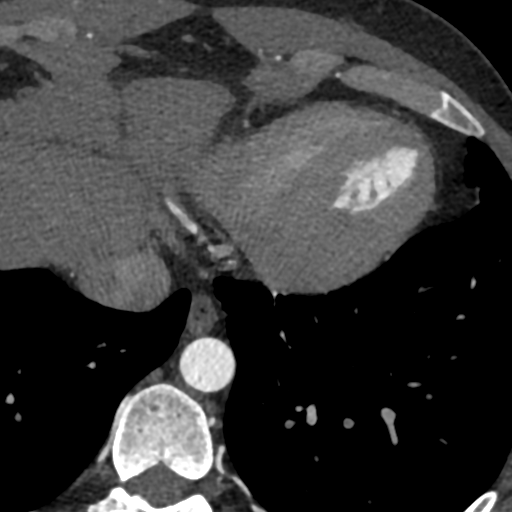
[im 129/386  vessel]
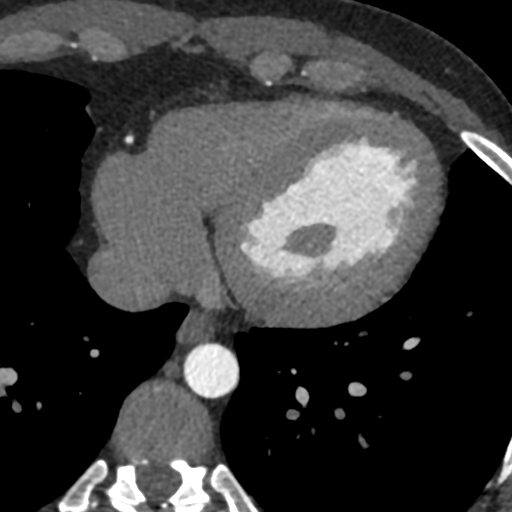
[im 172/386  vessel]
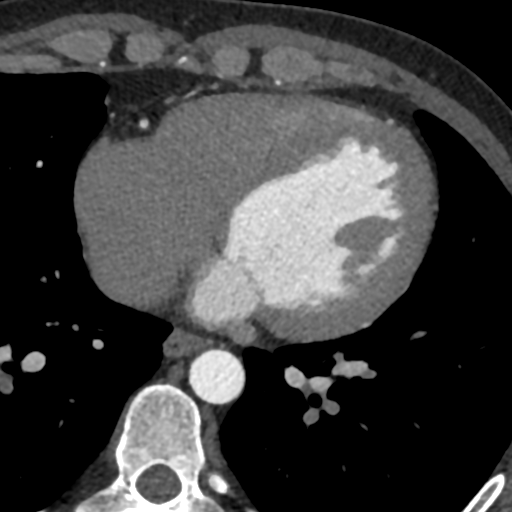
[im 214/386  vessel]
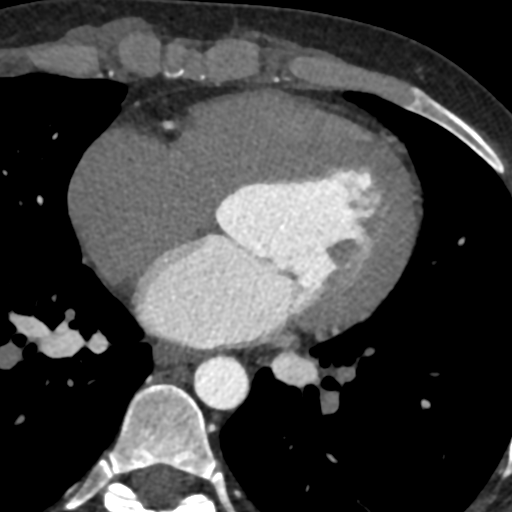
[im 214/386  lung]
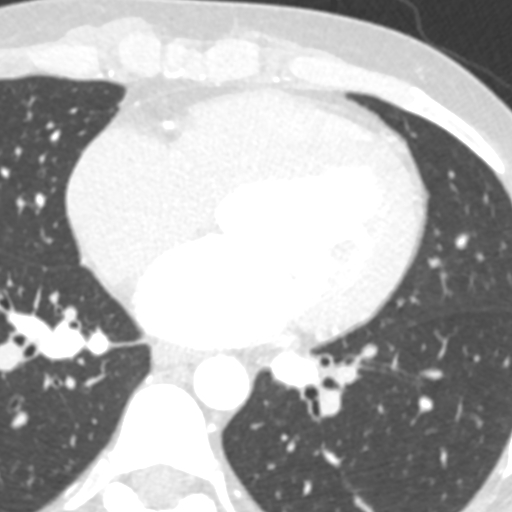
[im 257/386  vessel]
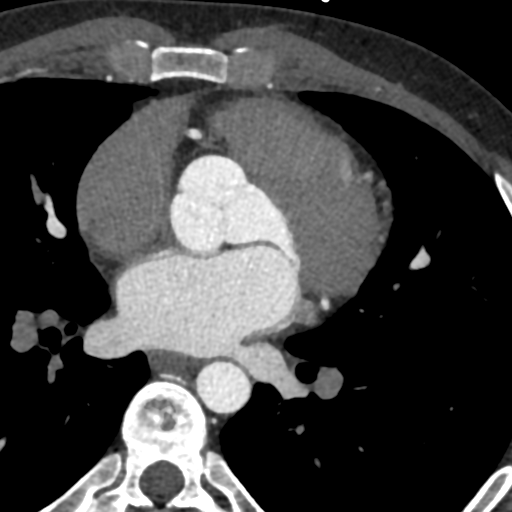
[im 300/386  vessel]
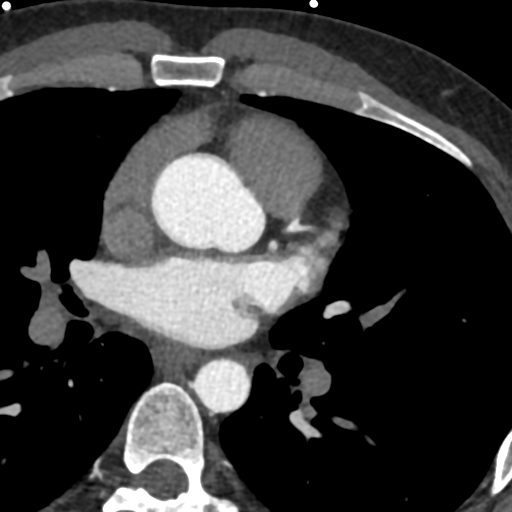
[im 343/386  vessel]
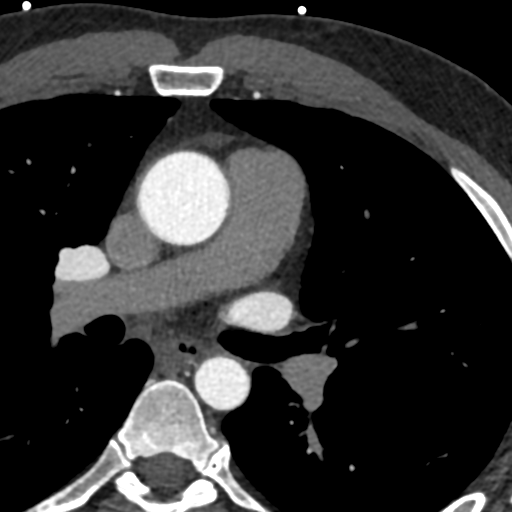

[11 of 20 positions shown; findings below may reference images not displayed]

FINDINGS: Within the visualized portions of the thorax there are no suspicious
appearing pulmonary nodules or masses, there is no acute
consolidative airspace disease, no pleural effusions, no
pneumothorax and no lymphadenopathy. Visualized portions of the
upper abdomen are unremarkable. There are no aggressive appearing
lytic or blastic lesions noted in the visualized portions of the
skeleton.
IMPRESSION: No significant incidental noncardiac findings are noted.
FINDINGS: A 120 kV prospective scan was triggered in the descending thoracic
aorta at 111 HU's. Gantry rotation speed was 280 msecs and
collimation was .9 mm. No beta blockade and no NTG was given. The 3D
data set was reconstructed in 5% intervals of the 60-80 % of the R-R
cycle. Diastolic phases were analyzed on a dedicated work station
using MPR, MIP and VRT modes. The patient received 80 cc of
contrast.

There is normal pulmonary vein drainage into the left atrium (2 on
the right and 2 on the left) with ostial measurements as follows:

RUPV: 19.7 x 18.5 mm

RLPV: 18.0 x 16.9 mm

LUPV: 27.1 x 12.2 mm

LLPV: 21.1 x 7.0 mm

The left atrial appendage is large chicken wing two lobes and ostial
size 28 x 19 mm and length 40 mm. There is no thrombus in the left
atrial appendage.

The esophagus runs in the left atrial midline and is not in the
proximity to any of the pulmonary veins.

Aorta:  Normal caliber.  No dissection or calcifications.

Aortic Valve:  Trileaflet.  No calcifications.

Coronary Arteries: Normal coronary origin. Right dominance. No
coronary calcification. The study was performed without use of NTG
and insufficient for plaque evaluation.
IMPRESSION: 1. There is normal pulmonary vein drainage into the left atrium.

2. The left atrial appendage is large chicken wing two lobes and
ostial size 28 x 19 mm and length 40 mm. There is no thrombus in the
left atrial appendage.

3. The esophagus runs in the left atrial midline and is not in the
proximity to any of the pulmonary veins.

4.  Coronary calcium score 0.

*** End of Addendum ***
EXAM:
OVER-READ INTERPRETATION  CT CHEST

The following report is an over-read performed by radiologist Dr.
Naqiah Mud [REDACTED] on 04/23/2020. This
over-read does not include interpretation of cardiac or coronary
anatomy or pathology. The coronary calcium score/coronary CTA
interpretation by the cardiologist is attached.
FINDINGS: Within the visualized portions of the thorax there are no suspicious
appearing pulmonary nodules or masses, there is no acute
consolidative airspace disease, no pleural effusions, no
pneumothorax and no lymphadenopathy. Visualized portions of the
upper abdomen are unremarkable. There are no aggressive appearing
lytic or blastic lesions noted in the visualized portions of the
skeleton.
IMPRESSION: No significant incidental noncardiac findings are noted.

## 2023-03-02 ENCOUNTER — Encounter: Payer: Self-pay | Admitting: Student

## 2023-03-13 ENCOUNTER — Other Ambulatory Visit: Payer: Self-pay | Admitting: Cardiology

## 2023-04-26 DIAGNOSIS — S335XXA Sprain of ligaments of lumbar spine, initial encounter: Secondary | ICD-10-CM | POA: Diagnosis not present

## 2023-04-26 DIAGNOSIS — S134XXA Sprain of ligaments of cervical spine, initial encounter: Secondary | ICD-10-CM | POA: Diagnosis not present

## 2023-04-26 DIAGNOSIS — S233XXA Sprain of ligaments of thoracic spine, initial encounter: Secondary | ICD-10-CM | POA: Diagnosis not present

## 2023-04-26 DIAGNOSIS — M531 Cervicobrachial syndrome: Secondary | ICD-10-CM | POA: Diagnosis not present

## 2023-05-30 ENCOUNTER — Telehealth: Payer: Self-pay | Admitting: Cardiology

## 2023-05-30 NOTE — Telephone Encounter (Signed)
 Called pt to discuss his request, aware that typically we do not provide FMLA for afib (minus procedures).  He reports that he has noticed he has been going in and out of afib more frequently, thinks he is in afib more than he is in sinus rhythm. Confirms he is taking his Flecainide regularly and that he may need another ablation. Informed him that he would need to discuss FMLA need w/ MD, says he made an appt (in June).  Aware I had our scheduler place him on wait list if sooner appt becomes available.  Advised to call the office if need to address afib control and/or symptoms (denies any symptoms currently) prior to his scheduled OV.  Patient verbalized understanding and agreeable to plan.

## 2023-05-30 NOTE — Telephone Encounter (Signed)
 Patient calling in about FMLA papers. Please advise

## 2023-05-30 NOTE — Telephone Encounter (Signed)
 Pt last FMLA done in 2022 and he is asking for renewal...  Will send to [facility] for review.

## 2023-05-31 DIAGNOSIS — H02814 Retained foreign body in left upper eyelid: Secondary | ICD-10-CM | POA: Diagnosis not present

## 2023-06-16 ENCOUNTER — Telehealth: Payer: Self-pay | Admitting: Cardiology

## 2023-06-16 DIAGNOSIS — R0600 Dyspnea, unspecified: Secondary | ICD-10-CM | POA: Diagnosis not present

## 2023-06-16 DIAGNOSIS — Z6826 Body mass index (BMI) 26.0-26.9, adult: Secondary | ICD-10-CM | POA: Diagnosis not present

## 2023-06-16 NOTE — Telephone Encounter (Signed)
 Offered pt sooner appt w/ Dr. [name], but he said he is ok to wait for now.  He is currently at PCP getting a CXR and states he is more concerned about his lungs/sob at this time.  Offered to hold an ablation date as well, but pt said no at this time.  Will address what is needed at his OV in couple of weeks. He appreciates my follow up.

## 2023-06-16 NOTE — Telephone Encounter (Signed)
 Spoke with Pt. He has a appointment with his PCP today (was on the way). Pt will have any labs etc sent to our office.

## 2023-06-16 NOTE — Telephone Encounter (Signed)
 Pt c/o Shortness Of Breath: STAT if SOB developed within the last 24 hours or pt is noticeably SOB on the phone  1. Are you currently SOB (can you hear that pt is SOB on the phone)? He says he has a respiratory infection right now, but prior to this he was.   2. How long have you been experiencing SOB? Been going on for a while  3. Are you SOB when sitting or when up moving around? Doesn't matter position/moving  4. Are you currently experiencing any other symptoms? Just triage   Spoke w/ patient to move up his 6/23 appointment with Dr. [name] to sooner to discuss need for another ablation and FMLA papers (see 4/8 phone note) when patient stated that he has been sob for a while and wondered if he should go somewhere to be seen. He states "I'm not dying or nothing, but maybe I am or maybe I ain't". He says he has been dealing with it for several months. He reports he has quit smoking, so says it could be COPD or asthma, or maybe anxiety as he is expecting a child in 3 weeks. Advised patient I would forward to Dr. [name] and a nurse would call him back to discuss. Appointment that patient was called regarding to move up, was rescheduled to 5/12 w/ Dr. [name].

## 2023-07-03 ENCOUNTER — Encounter: Payer: Self-pay | Admitting: Cardiology

## 2023-07-03 ENCOUNTER — Ambulatory Visit: Attending: Cardiology | Admitting: Cardiology

## 2023-07-03 ENCOUNTER — Ambulatory Visit

## 2023-07-03 VITALS — BP 120/86 | HR 78 | Ht 69.0 in | Wt 192.0 lb

## 2023-07-03 DIAGNOSIS — I491 Atrial premature depolarization: Secondary | ICD-10-CM

## 2023-07-03 DIAGNOSIS — I4819 Other persistent atrial fibrillation: Secondary | ICD-10-CM

## 2023-07-03 NOTE — Progress Notes (Unsigned)
 Enrolled for [employer] to mail a ZIO XT long term holter monitor to the patients address on file.

## 2023-07-03 NOTE — Progress Notes (Signed)
  Electrophysiology Office Note:   Date:  07/03/2023  ID:  Patrick Chase, DOB [birth year], MRN [id number]  Primary Cardiologist: None Primary Heart Failure: None Electrophysiologist: Patrick Gales Cortland Ding, MD      History of Present Illness:   Patrick Chase is a 38 y.o. male with h/o atrial fibrillation, SVT/A. tach, GERD, seen today for routine electrophysiology followup.   Since last being seen in our clinic the patient reports increasing shortness of breath.  He has had some respiratory illnesses in the last year that he thinks may be contributing.  This has been worked up by his primary physician.  He has been more short of breath over the last few months.  This is an intermittent issue.  He notes palpitations that are associated with shortness of breath.  He is in sinus rhythm with PACs today.  he denies chest pain, palpitations, dyspnea, PND, orthopnea, nausea, vomiting, dizziness, syncope, edema, weight gain, or early satiety.   Review of systems complete and found to be negative unless listed in HPI.   EP Information / Studies Reviewed:    EKG is ordered today. Personal review as below.  EKG Interpretation Date/Time:  Monday Jul 03 2023 15:58:06 EDT Ventricular Rate:  72 PR Interval:  162 QRS Duration:  108 QT Interval:  416 QTC Calculation: 455 R Axis:   -35  Text Interpretation: Sinus rhythm with Blocked Premature atrial complexes with occasional Premature ventricular complexes Left axis deviation When compared with ECG of 17-Jan-2023 11:38, Premature atrial complexes are now Confirmed by Patrick Chase ([id number]) on 07/03/2023 4:09:49 PM     Risk Assessment/Calculations:    CHA2DS2-VASc Score = 0   This indicates a 0.2% annual risk of stroke. The patient's score is based upon: CHF History: 0 HTN History: 0 Diabetes History: 0 Stroke History: 0 Vascular Disease History: 0 Age Score: 0 Gender Score: 0              Physical Exam:   VS:  BP 120/86 (BP Location: Right  Arm, Patient Position: Sitting, Cuff Size: Large)   Pulse 78   Ht 5\' 9"  (1.753 m)   Wt 192 lb (87.1 kg)   SpO2 97%   BMI 28.35 kg/m    Wt Readings from Last 3 Encounters:  07/03/23 192 lb (87.1 kg)  01/17/23 199 lb 6.4 oz (90.4 kg)  12/31/21 186 lb (84.4 kg)     GEN: Well nourished, well developed in no acute distress NECK: No JVD; No carotid bruits CARDIAC: Regular rate and rhythm with occasional ectopy, no murmurs, rubs, gallops RESPIRATORY:  Clear to auscultation without rales, wheezing or rhonchi  ABDOMEN: Soft, non-tender, non-distended EXTREMITIES:  No edema; No deformity   ASSESSMENT AND PLAN:    1.  Persistent atrial fibrillation/atrial tachycardia: Post ablation March 2022.  He has had no obvious recurrence from his atrial fibrillation.  He is having a high burden of PACs today.  Patrick Chase have him wear a 2-week monitor.  If PACs are significantly elevated, may move forward with ablation.  2.  PVCs: On flecainide   3.  Heart failure with reduced ejection fraction: Ejection fraction has normalized  Follow up with Patrick Chase pending monitor   Signed, Patrick Sandall Cortland Ding, MD

## 2023-07-03 NOTE — Patient Instructions (Signed)
 Medication Instructions:  Your physician recommends that you continue on your current medications as directed. Please refer to the Current Medication list given to you today.  *If you need a refill on your cardiac medications before your next appointment, please call your pharmacy*  Testing/Procedures: Event Monitor  Your physician has recommended that you wear an event monitor. Event monitors are medical devices that record the heart's electrical activity. Doctors most often us  these monitors to diagnose arrhythmias. Arrhythmias are problems with the speed or rhythm of the heartbeat. The monitor is a small, portable device. You can wear one while you do your normal daily activities. This is usually used to diagnose what is causing palpitations/syncope (passing out).  Follow-Up: At [facility], you and your health needs are our priority.  As part of our continuing mission to provide you with exceptional heart care, our providers are all part of one team.  This team includes your primary Cardiologist (physician) and Advanced Practice Providers or APPs (Physician Assistants and Nurse Practitioners) who all work together to provide you with the care you need, when you need it.  Your next appointment:   To be determined based on monitor results  ZIO XT- Long Term Monitor Instructions  Your physician has requested you wear a ZIO patch monitor for 14 days.  This is a single patch monitor. [employer] supplies one patch monitor per enrollment. Additional stickers are not available. Please do not apply patch if you will be having a Nuclear Stress Test,  Echocardiogram, Cardiac CT, MRI, or Chest Xray during the period you would be wearing the  monitor. The patch cannot be worn during these tests. You cannot remove and re-apply the  ZIO XT patch monitor.  Your ZIO patch monitor will be mailed 3 day USPS to your address on file. It may take 3-5 days  to receive your monitor after you have been  enrolled.  Once you have received your monitor, please review the enclosed instructions. Your monitor  has already been registered assigning a specific monitor serial # to you.  Billing and Patient Assistance Program Information  We have supplied [employer] with any of your insurance information on file for billing purposes. [employer] offers a sliding scale Patient Assistance Program for patients that do not have  insurance, or whose insurance does not completely cover the cost of the ZIO monitor.  You must apply for the Patient Assistance Program to qualify for this discounted rate.  To apply, please call [employer] at [phone], select option 4, select option 2, ask to apply for  Patient Assistance Program. Sanna Crystal will ask your household income, and how many people  are in your household. They will quote your out-of-pocket cost based on that information.  [employer] will also be able to set up a 14-month, interest-free payment plan if needed.  Applying the monitor   Shave hair from upper left chest.  Hold abrader disc by orange tab. Rub abrader in 40 strokes over the upper left chest as  indicated in your monitor instructions.  Clean area with 4 enclosed alcohol pads. Let dry.  Apply patch as indicated in monitor instructions. Patch will be placed under collarbone on left  side of chest with arrow pointing upward.  Rub patch adhesive wings for 2 minutes. Remove white label marked "1". Remove the white  label marked "2". Rub patch adhesive wings for 2 additional minutes.  While looking in a mirror, press and release button in center of patch. A small green light  will  flash 3-4 times. This will be your only indicator that the monitor has been turned on.  Do not shower for the first 24 hours. You may shower after the first 24 hours.  Press the button if you feel a symptom. You will hear a small click. Record Date, Time and  Symptom in the Patient Logbook.  When you are ready to remove the  patch, follow instructions on the last 2 pages of Patient  Logbook. Stick patch monitor onto the last page of Patient Logbook.  Place Patient Logbook in the blue and white box. Use locking tab on box and tape box closed  securely. The blue and white box has prepaid postage on it. Please place it in the mailbox as  soon as possible. Your physician should have your test results approximately 7 days after the  monitor has been mailed back to [facility].  Call [facility] Customer Care at [phone] if you have questions regarding  your ZIO XT patch monitor. Call them immediately if you see an orange light blinking on your  monitor.  If your monitor falls off in less than 4 days, contact our Monitor department at [phone].  If your monitor becomes loose or falls off after 4 days call [employer] at [phone] for  suggestions on securing your monitor

## 2023-07-05 ENCOUNTER — Telehealth: Payer: Self-pay | Admitting: Cardiology

## 2023-07-05 NOTE — Telephone Encounter (Signed)
 Patient drop off insurance form. Will leave in provider box

## 2023-07-11 ENCOUNTER — Telehealth: Payer: Self-pay | Admitting: Cardiology

## 2023-07-11 NOTE — Telephone Encounter (Signed)
 Spoke with Patrick Chase and let her know Patrick Chase's recommendations. Patrick Chase stated understanding.

## 2023-07-11 NOTE — Telephone Encounter (Signed)
 Pt followed by Patrick Chase and [name] for Persistent A-fib and PAC's. Ultimately will need a cardiac clearance form, but until surgery is scheduled, they will not send the form. Wife wants general answer on if this is a safe procedure, then will go through appropriate channels. Routing to [place] for input.

## 2023-07-11 NOTE — Telephone Encounter (Signed)
 Wife wants to know if it is safe for him to have a vasectomy with his heart condition.

## 2023-08-03 DIAGNOSIS — E782 Mixed hyperlipidemia: Secondary | ICD-10-CM | POA: Diagnosis not present

## 2023-08-03 DIAGNOSIS — I10 Essential (primary) hypertension: Secondary | ICD-10-CM | POA: Diagnosis not present

## 2023-08-03 DIAGNOSIS — Z131 Encounter for screening for diabetes mellitus: Secondary | ICD-10-CM | POA: Diagnosis not present

## 2023-08-03 DIAGNOSIS — E7849 Other hyperlipidemia: Secondary | ICD-10-CM | POA: Diagnosis not present

## 2023-08-03 DIAGNOSIS — Z1329 Encounter for screening for other suspected endocrine disorder: Secondary | ICD-10-CM | POA: Diagnosis not present

## 2023-08-03 DIAGNOSIS — D751 Secondary polycythemia: Secondary | ICD-10-CM | POA: Diagnosis not present

## 2023-08-03 DIAGNOSIS — R002 Palpitations: Secondary | ICD-10-CM | POA: Diagnosis not present

## 2023-08-14 ENCOUNTER — Ambulatory Visit: Admitting: Cardiology

## 2023-08-14 DIAGNOSIS — I4819 Other persistent atrial fibrillation: Secondary | ICD-10-CM | POA: Diagnosis not present

## 2023-08-14 DIAGNOSIS — I491 Atrial premature depolarization: Secondary | ICD-10-CM | POA: Diagnosis not present

## 2023-08-15 DIAGNOSIS — I4819 Other persistent atrial fibrillation: Secondary | ICD-10-CM

## 2023-08-15 DIAGNOSIS — I491 Atrial premature depolarization: Secondary | ICD-10-CM

## 2023-08-16 ENCOUNTER — Ambulatory Visit: Payer: Self-pay | Admitting: Cardiology

## 2023-08-16 DIAGNOSIS — F1010 Alcohol abuse, uncomplicated: Secondary | ICD-10-CM | POA: Diagnosis not present

## 2023-08-16 DIAGNOSIS — Z6826 Body mass index (BMI) 26.0-26.9, adult: Secondary | ICD-10-CM | POA: Diagnosis not present

## 2023-08-16 DIAGNOSIS — I4891 Unspecified atrial fibrillation: Secondary | ICD-10-CM | POA: Diagnosis not present

## 2023-08-16 DIAGNOSIS — E782 Mixed hyperlipidemia: Secondary | ICD-10-CM | POA: Diagnosis not present

## 2023-08-16 DIAGNOSIS — F17210 Nicotine dependence, cigarettes, uncomplicated: Secondary | ICD-10-CM | POA: Diagnosis not present

## 2023-08-16 DIAGNOSIS — Z0001 Encounter for general adult medical examination with abnormal findings: Secondary | ICD-10-CM | POA: Diagnosis not present

## 2023-08-16 DIAGNOSIS — E781 Pure hyperglyceridemia: Secondary | ICD-10-CM | POA: Diagnosis not present

## 2023-09-05 ENCOUNTER — Encounter: Payer: Self-pay | Admitting: Student

## 2023-09-18 DIAGNOSIS — Z3009 Encounter for other general counseling and advice on contraception: Secondary | ICD-10-CM | POA: Diagnosis not present

## 2023-09-26 ENCOUNTER — Encounter: Payer: Self-pay | Admitting: Cardiology

## 2023-09-26 ENCOUNTER — Ambulatory Visit: Attending: Cardiology | Admitting: Cardiology

## 2023-09-26 VITALS — BP 140/96 | HR 88 | Ht 69.0 in | Wt 204.0 lb

## 2023-09-26 DIAGNOSIS — I493 Ventricular premature depolarization: Secondary | ICD-10-CM

## 2023-09-26 DIAGNOSIS — I491 Atrial premature depolarization: Secondary | ICD-10-CM | POA: Diagnosis not present

## 2023-09-26 DIAGNOSIS — I4819 Other persistent atrial fibrillation: Secondary | ICD-10-CM

## 2023-09-26 NOTE — Progress Notes (Signed)
 Electrophysiology Office Note:   Date:  09/26/2023  ID:  Patrick Chase, DOB Sep 19, 1985, MRN [id number]  Primary Cardiologist: None Primary Heart Failure: None Electrophysiologist: Tyresha Fede Gladis Norton, MD      History of Present Illness:   Patrick Chase is a 38 y.o. male with h/o atrial fibrillation, atrial tachycardia, PACs seen today for routine electrophysiology followup.   Discussed the use of AI scribe software for clinical note transcription with the patient, who gave verbal consent to proceed.  History of Present Illness   Patrick Chase is a 38 year old male with atrial fibrillation who presents with frequent premature atrial contractions.  He experiences frequent premature atrial contractions, with early beats occurring 28% of the time as indicated by a monitor. Flecainide  is not effectively controlling these episodes. He perceives irregular beats, especially at rest, with every other beat feeling abnormal.  He has undergone successful ablation for atrial fibrillation in the past. He frequently experiences shortness of breath, particularly when lying down, with symptoms less noticeable during physical activity such as walking.  He is considering lifestyle changes, including reducing alcohol intake. Symptoms are more pronounced when calm and at rest compared to during physical activity.      he denies chest pain, PND, orthopnea, nausea, vomiting, dizziness, syncope, edema, weight gain, or early satiety.   Review of systems complete and found to be negative unless listed in HPI.   EP Information / Studies Reviewed:    EKG is ordered today. Personal review as below.       Risk Assessment/Calculations:    CHA2DS2-VASc Score = 0   This indicates a 0.2% annual risk of stroke. The patient's score is based upon: CHF History: 0 HTN History: 0 Diabetes History: 0 Stroke History: 0 Vascular Disease History: 0 Age Score: 0 Gender Score: 0             Physical Exam:   VS:   There were no vitals taken for this visit.   Wt Readings from Last 3 Encounters:  07/03/23 192 lb (87.1 kg)  01/17/23 199 lb 6.4 oz (90.4 kg)  12/31/21 186 lb (84.4 kg)     GEN: Well nourished, well developed in no acute distress NECK: No JVD; No carotid bruits CARDIAC: Regular rate and rhythm, no murmurs, rubs, gallops RESPIRATORY:  Clear to auscultation without rales, wheezing or rhonchi  ABDOMEN: Soft, non-tender, non-distended EXTREMITIES:  No edema; No deformity   ASSESSMENT AND PLAN:    1.  Persistent atrial fibrillation/atrial tachycardia: Post ablation March 2022.  No obvious recurrence of atrial fibrillation.  Does have a high burden of PACs with a burden of 28% on most recent monitor.  He feels quite poorly in his arrhythmia.  Due to that, we Talullah Abate plan for ablation.  Risks and benefits have been discussed.  He understands the risks and is agreed to the procedure.  Risk, benefits, and alternatives to EP study and radiofrequency ablation for SVT were also discussed in detail today. These risks include but are not limited to stroke, bleeding, vascular damage, tamponade, perforation, damage to the esophagus, lungs, and other structures, pulmonary vein stenosis, worsening renal function, and death. The patient understands these risk and wishes to proceed.  We Charlize Hathaway therefore proceed with catheter ablation at the next available time.    2.  PVCs: On flecainide .  3.  Heart failure with reduced ejection fraction: Ejection fraction has normalized  Follow up with Dr. Norton as usual post procedure  Signed, Evadna Donaghy  Gladis Norton, MD

## 2023-10-04 ENCOUNTER — Telehealth: Payer: Self-pay

## 2023-10-04 DIAGNOSIS — I4819 Other persistent atrial fibrillation: Secondary | ICD-10-CM

## 2023-10-04 DIAGNOSIS — E7849 Other hyperlipidemia: Secondary | ICD-10-CM | POA: Diagnosis not present

## 2023-10-04 DIAGNOSIS — Z833 Family history of diabetes mellitus: Secondary | ICD-10-CM | POA: Diagnosis not present

## 2023-10-04 DIAGNOSIS — Z01812 Encounter for preprocedural laboratory examination: Secondary | ICD-10-CM

## 2023-10-04 DIAGNOSIS — E781 Pure hyperglyceridemia: Secondary | ICD-10-CM | POA: Diagnosis not present

## 2023-10-04 DIAGNOSIS — I10 Essential (primary) hypertension: Secondary | ICD-10-CM | POA: Diagnosis not present

## 2023-10-04 NOTE — Telephone Encounter (Signed)
 Attempted to call patient to schedule an ablation with Dr. Inocencio. Unable to leave voicemail.

## 2023-10-06 NOTE — Telephone Encounter (Signed)
 Pt returning nurses from 8/13 regarding Ablation. Please advise

## 2023-10-06 NOTE — Telephone Encounter (Signed)
 Spoke with the patient who states that he would like to wait to have his ablation done in December. I provided him with available dates. He is going to talk with his wife and call us  back.

## 2023-10-13 NOTE — Telephone Encounter (Signed)
 Pt called in to schedule ablation and would like to know the dates that are available in December.

## 2023-10-16 NOTE — Telephone Encounter (Signed)
 Returned pt's call and he is scheduled for [facility] Ablation with Dr. Inocencio on 12/29 at 1230 pm. He will have updated labs done on 12/12 at [facility].  I will send Instruction letter via MyChart per pt's request.

## 2023-11-06 NOTE — Telephone Encounter (Signed)
 Pt would like to move procedure date up.  Will reschedule to 11/20 per pt request.  Aware office will be in touch

## 2023-11-27 NOTE — Addendum Note (Signed)
 Addended by: GRETEL MAEOLA CROME on: 11/27/2023 05:26 PM   Modules accepted: Orders

## 2023-12-01 DIAGNOSIS — B9689 Other specified bacterial agents as the cause of diseases classified elsewhere: Secondary | ICD-10-CM | POA: Diagnosis not present

## 2023-12-01 DIAGNOSIS — J208 Acute bronchitis due to other specified organisms: Secondary | ICD-10-CM | POA: Diagnosis not present

## 2023-12-01 DIAGNOSIS — R030 Elevated blood-pressure reading, without diagnosis of hypertension: Secondary | ICD-10-CM | POA: Diagnosis not present

## 2023-12-19 DIAGNOSIS — I493 Ventricular premature depolarization: Secondary | ICD-10-CM | POA: Diagnosis not present

## 2023-12-19 DIAGNOSIS — Z6828 Body mass index (BMI) 28.0-28.9, adult: Secondary | ICD-10-CM | POA: Diagnosis not present

## 2023-12-19 DIAGNOSIS — Z833 Family history of diabetes mellitus: Secondary | ICD-10-CM | POA: Diagnosis not present

## 2023-12-19 DIAGNOSIS — F419 Anxiety disorder, unspecified: Secondary | ICD-10-CM | POA: Diagnosis not present

## 2023-12-21 ENCOUNTER — Emergency Department (HOSPITAL_COMMUNITY)
Admission: EM | Admit: 2023-12-21 | Discharge: 2023-12-21 | Disposition: A | Discharge status: Home / Self Care | Attending: Emergency Medicine | Admitting: Emergency Medicine

## 2023-12-21 ENCOUNTER — Telehealth: Payer: Self-pay | Admitting: Cardiology

## 2023-12-21 ENCOUNTER — Other Ambulatory Visit: Payer: Self-pay

## 2023-12-21 ENCOUNTER — Encounter (HOSPITAL_COMMUNITY): Payer: Self-pay | Admitting: Emergency Medicine

## 2023-12-21 ENCOUNTER — Emergency Department (HOSPITAL_COMMUNITY)

## 2023-12-21 DIAGNOSIS — R000 Tachycardia, unspecified: Secondary | ICD-10-CM | POA: Diagnosis not present

## 2023-12-21 DIAGNOSIS — I10 Essential (primary) hypertension: Secondary | ICD-10-CM | POA: Diagnosis not present

## 2023-12-21 DIAGNOSIS — Z79899 Other long term (current) drug therapy: Secondary | ICD-10-CM | POA: Insufficient documentation

## 2023-12-21 DIAGNOSIS — R0600 Dyspnea, unspecified: Secondary | ICD-10-CM

## 2023-12-21 DIAGNOSIS — R0602 Shortness of breath: Secondary | ICD-10-CM | POA: Diagnosis not present

## 2023-12-21 DIAGNOSIS — D72829 Elevated white blood cell count, unspecified: Secondary | ICD-10-CM | POA: Diagnosis not present

## 2023-12-21 DIAGNOSIS — I491 Atrial premature depolarization: Secondary | ICD-10-CM | POA: Diagnosis not present

## 2023-12-21 LAB — CBC
HCT: 50.5 % (ref 39.0–52.0)
Hemoglobin: 17.8 g/dL — ABNORMAL HIGH (ref 13.0–17.0)
MCH: 33.5 pg (ref 26.0–34.0)
MCHC: 35.2 g/dL (ref 30.0–36.0)
MCV: 95.1 fL (ref 80.0–100.0)
Platelets: 380 K/uL (ref 150–400)
RBC: 5.31 MIL/uL (ref 4.22–5.81)
RDW: 12 % (ref 11.5–15.5)
WBC: 13.5 K/uL — ABNORMAL HIGH (ref 4.0–10.5)
nRBC: 0 % (ref 0.0–0.2)

## 2023-12-21 LAB — RESP PANEL BY RT-PCR (RSV, FLU A&B, COVID)  RVPGX2
Influenza A by PCR: NEGATIVE
Influenza B by PCR: NEGATIVE
Resp Syncytial Virus by PCR: NEGATIVE
SARS Coronavirus 2 by RT PCR: NEGATIVE

## 2023-12-21 LAB — BASIC METABOLIC PANEL WITH GFR
Anion gap: 12 (ref 5–15)
BUN: 11 mg/dL (ref 6–20)
CO2: 27 mmol/L (ref 22–32)
Calcium: 9.7 mg/dL (ref 8.9–10.3)
Chloride: 99 mmol/L (ref 98–111)
Creatinine, Ser: 0.9 mg/dL (ref 0.61–1.24)
GFR, Estimated: >60 mL/min (ref >60)
Glucose, Bld: 116 mg/dL — ABNORMAL HIGH (ref 70–99)
Potassium: 3.9 mmol/L (ref 3.5–5.1)
Sodium: 138 mmol/L (ref 135–145)

## 2023-12-21 LAB — PRO BRAIN NATRIURETIC PEPTIDE: Pro Brain Natriuretic Peptide: 107 pg/mL (ref <300.0)

## 2023-12-21 LAB — TROPONIN T, HIGH SENSITIVITY: Troponin T High Sensitivity: <15 ng/L (ref 0–19)

## 2023-12-21 LAB — MAGNESIUM: Magnesium: 2.2 mg/dL (ref 1.7–2.4)

## 2023-12-21 LAB — D-DIMER, QUANTITATIVE: D-Dimer, Quant: 0.45 ug{FEU}/mL (ref 0.00–0.50)

## 2023-12-21 MED ORDER — DILTIAZEM HCL 30 MG PO TABS
60.0000 mg | ORAL_TABLET | Freq: Once | ORAL | Status: AC
  Administered 2023-12-21: 60 mg via ORAL
  Filled 2023-12-21: qty 2

## 2023-12-21 MED ORDER — DILTIAZEM HCL 60 MG PO TABS: 60.0000 mg | ORAL_TABLET | Freq: Three times a day (TID) | ORAL | 0 refills | Status: DC

## 2023-12-21 NOTE — ED Triage Notes (Signed)
 Pt in by POV c/o of increasing SOB and chest flutter that started in the middle of the night. Pt has hx of the same. States he is schedule for a ablation  next month.

## 2023-12-21 NOTE — Telephone Encounter (Signed)
 Spoke with pt wife, aware dr inocencio is not in the office today. Will forward for his review but can not guarantee he will answer by tomorrow. She reports they have already paid for the procedure. Aware will forward for dr inocencio to review.

## 2023-12-21 NOTE — Telephone Encounter (Signed)
 Pt's wife is requesting a callback regarding her wanting to know if is okay to have vasectomy done tomorrow after being in ED yesterday and taking multiple medications. Please advise.

## 2023-12-21 NOTE — ED Provider Notes (Signed)
 [place] EMERGENCY DEPARTMENT AT [facility] Provider Note   CSN: [id number] Arrival date & time: 12/21/23  9147     Patient presents with: Shortness of Breath   Patrick Chase is a 38 y.o. male.  He has history of atrial fibrillation status post ablation, atrial tachycardia and PACs, hypertension.  He is on metoprolol  and flecainide .  He follows up with electrophysiology with Dr. Inocencio.  States he been having palpitations with premature beats frequently.  He does not use stimulants, is pending ablation in January 11, 2024 but states that his shortness of breath has been worse and his palpitations also seem to be worse.  He reports he has been having some chest tightness as well.  He denies any leg pain or swelling, cough, fever, states that symptoms have been worse since he had bronchitis on 12/01/23.     Shortness of Breath      Prior to Admission medications   Medication Sig Start Date End Date Taking? Authorizing Provider  diltiazem  (CARDIZEM ) 60 MG tablet Take 1 tablet (60 mg total) by mouth 3 (three) times daily. 12/21/23  Yes Makeisha Jentsch A, PA-C  flecainide  (TAMBOCOR ) 150 MG tablet TAKE ONE TABLET (150 MG TOTAL) BY MOUTH TWO TIMES DAILY. 03/14/23   Camnitz, Soyla Lunger, MD  metoprolol  succinate (TOPROL -XL) 100 MG 24 hr tablet TAKE ONE TABLET (100 MG TOTAL) BY MOUTH DAILY. TAKE WITH OR IMMEDIATELY FOLLOWING A MEAL. 03/14/23   Camnitz, Soyla Lunger, MD  Multiple Vitamin (MULTIVITAMIN) tablet Take 1 tablet by mouth daily.    [provider]  omeprazole (PRILOSEC OTC) 20 MG tablet Take 20 mg by mouth daily.    [provider]  VALIUM 10 MG tablet Take 10 mg by mouth once. 09/18/23   [provider]    Allergies: Patient has no known allergies.    Review of Systems  Respiratory:  Positive for shortness of breath.     Updated Vital Signs BP (!) 160/100 (BP Location: Left Arm)   Pulse 81   Temp 98.9 F (37.2 C) (Oral)   Resp 18   Ht  5' 9 (1.753 m)   Wt 95.3 kg   SpO2 96%   BMI 31.01 kg/m   Physical Exam Vitals and nursing note reviewed.  Constitutional:      General: He is not in acute distress.    Appearance: He is well-developed.  HENT:     Head: Normocephalic and atraumatic.  Eyes:     Conjunctiva/sclera: Conjunctivae normal.  Cardiovascular:     Rate and Rhythm: Normal rate. Rhythm irregular.     Heart sounds: No murmur heard. Pulmonary:     Effort: Pulmonary effort is normal. No respiratory distress.     Breath sounds: Normal breath sounds.  Abdominal:     Palpations: Abdomen is soft.     Tenderness: There is no abdominal tenderness.  Musculoskeletal:        General: No swelling.     Cervical back: Neck supple.  Skin:    General: Skin is warm and dry.     Capillary Refill: Capillary refill takes less than 2 seconds.  Neurological:     General: No focal deficit present.     Mental Status: He is alert and oriented to person, place, and time.  Psychiatric:        Mood and Affect: Mood normal.     (all labs ordered are listed, but only abnormal results are displayed) Labs Reviewed  BASIC  METABOLIC PANEL WITH GFR - Abnormal; Notable for the following components:      Result Value   Glucose, Bld 116 (*)    All other components within normal limits  CBC - Abnormal; Notable for the following components:   WBC 13.5 (*)    Hemoglobin 17.8 (*)    All other components within normal limits  RESP PANEL BY RT-PCR (RSV, FLU A&B, COVID)  RVPGX2  MAGNESIUM  PRO BRAIN NATRIURETIC PEPTIDE  D-DIMER, QUANTITATIVE  TROPONIN T, HIGH SENSITIVITY    EKG: EKG Interpretation Date/Time:  Thursday December 21 2023 09:44:53 EDT Ventricular Rate:  110 PR Interval:  112 QRS Duration:  103 QT Interval:  364 QTC Calculation: 482 R Axis:   -30  Text Interpretation: Sinus or ectopic atrial tachycardia Multiform ventricular premature complexes Left axis deviation Borderline repolarization abnormality Borderline  prolonged QT interval Confirmed by Elnor Savant ([id number]) on 12/21/2023 10:30:07 AM  Radiology: DG Chest 2 View Result Date: 12/21/2023 CLINICAL DATA:  Shortness of breath. EXAM: CHEST - 2 VIEW COMPARISON:  06/16/2023 FINDINGS: Lungs are adequately inflated and otherwise clear. Cardiomediastinal silhouette and remainder the exam is unchanged. IMPRESSION: No active cardiopulmonary disease. Electronically Signed   By: Toribio Agreste M.D.   On: 12/21/2023 09:36     Procedures   Medications Ordered in the ED  diltiazem  (CARDIZEM ) tablet 60 mg (60 mg Oral Given 12/21/23 1151)                                    Medical Decision Making Differential diagnosis includes but not limited to ACS, PE, pneumonia, pneumothorax, GERD, arrhythmia, anemia, other  Labs: Patient's symptoms been constant for a long period of time, worse in the past couple of weeks since he had bronchitis.  Troponin is less than 15, BNP is normal, D-dimer is negative, magnesium normal, BMP normal, CBC with mild leukocytosis and hemoglobin slightly up at 17.8.    EKG/cardiac monitor-frequent premature contractions, ventricular heart rate has been controlled, he has been hypertensive.  Oxygen saturation 98% on room air   Consults: Consulted cardiology regarding patient's presentation and evaluation.  I spoke with Dr. Stacia on-call who recommended adding diltiazem  60 mg every 8 hours and follow-up with physiology.  ED course: Patient here for continued symptomatic PACs.  He scheduled for ablation with EP in 3 weeks but states symptoms have been worse since he had bronchitis a few weeks ago and have been persistent he is not hypoxic, his ventricular rate has been controlled, troponin negative, D-dimer negative, COVID flu RSV negative, consulted cardiology as above and will add diltiazem  as recommended.  I discussed with patient and he is agreeable plan of care, states he has tolerated diltiazem  in the past.  Given strict return  precautions.   Amount and/or Complexity of Data Reviewed Labs: ordered. Radiology: ordered.  Risk Prescription drug management.        Final diagnoses:  Premature atrial beats  Dyspnea, unspecified type    ED Discharge Orders          Ordered    diltiazem  (CARDIZEM ) 60 MG tablet  3 times daily        12/21/23 [address], PA-C 12/21/23 1222    Elnor Savant A, DO 12/26/23 1025

## 2023-12-21 NOTE — Discharge Instructions (Addendum)
 In the ER today for chest tightness, shortness of breath and palpitations.  Your workup was reassuring, your blood pressure was somewhat elevated.  Fortunately your cardiac enzyme test, screening test for blood clot (d-dimer) and electrolytes were all normal.  Your chest x-ray was normal, your EKG showed premature contractions.  I discussed your case with on-call cardiology, she recommended adding diltiazem  60 mg 3 times a day and follow-up with your electrophysiologist.  Come back to the ER if you have new or worsening symptoms.

## 2023-12-22 ENCOUNTER — Telehealth (HOSPITAL_COMMUNITY): Payer: Self-pay

## 2023-12-22 ENCOUNTER — Encounter (HOSPITAL_COMMUNITY): Payer: Self-pay

## 2023-12-22 DIAGNOSIS — Z3009 Encounter for other general counseling and advice on contraception: Secondary | ICD-10-CM | POA: Diagnosis not present

## 2023-12-22 NOTE — Telephone Encounter (Signed)
 Pt seen in ED on yesterday for continued symptomatic PACs and started on Diltiazem  60 mg 3 times daily. He is scheduled for an SVT ablation on 11/20 and is to hold Flecainide  and Metoprolol  for 3 days prior. Do you recommend holding Diltiazem  for the same amount of time?

## 2023-12-22 NOTE — Telephone Encounter (Signed)
 Attempted to reach patient to discuss upcoming procedure, no answer. Unable to leave message- VM full.

## 2023-12-22 NOTE — Telephone Encounter (Signed)
 Spoke with the patient's wife and advised that he is okay to proceed with a vasectomy.

## 2023-12-25 NOTE — Telephone Encounter (Signed)
 Attempted to reach patient to discuss upcoming procedure, no answer. VM full.

## 2023-12-26 NOTE — Telephone Encounter (Signed)
 Patient returned call to discuss upcoming procedure.      Health status review:  Any new medical conditions, recent signs of acute illness or been started on antibiotics? No Any recent hospitalizations or surgeries? 10/30- ED visit for PACs Any new medications started since pre-op visit? Yes; Diltiazem   Follow all medication instructions prior to procedure or the procedure may be rescheduled:    HOLD Flecainide , Metoprolol , and Diltiazem  for 3 days prior to your procedure. Last dose on Sunday, November 16.  Essential chronic medications:  No medication should be continued, unless told otherwise.  On the morning of your procedure DO NOT take any medication..  Nothing to eat or drink after midnight prior to your procedure.  Pre-procedure testing scheduled: lab work completed.  Confirmed patient is scheduled for SVT Ablation on Thursday, November 20 with Dr. Dr. Inocencio. Instructed patient to arrive at the Main Entrance A at [facility]: [address] [place], [place] [address] and check in at Admitting at 9:30 AM.  Plan to go home the same day, you will only stay overnight if medically necessary. You MUST have a responsible adult to drive you home and MUST be with you the first 24 hours after you arrive home or your procedure could be cancelled.  Informed a nurse may call a day before the procedure to confirm arrival time and ensure instructions are followed.  Patient verbalized understanding to information provided and is agreeable to proceed with procedure.   Advised to contact RN Navigator at [phone], to inform of any new medications started after call or concerns prior to procedure.

## 2024-01-10 NOTE — Pre-Procedure Instructions (Signed)
 Instructed patient on the following items: Arrival time 0930 Nothing to eat or drink after midnight No meds AM of procedure Responsible person to drive you home and stay with you for 24 hrs  Stopped Flecainide , metoprolol  and Diltiazem - last dose Sunday 11/16

## 2024-01-11 ENCOUNTER — Encounter (HOSPITAL_COMMUNITY): Admission: RE | Disposition: A | Payer: Self-pay | Source: Home / Self Care | Attending: Cardiology

## 2024-01-11 ENCOUNTER — Ambulatory Visit (HOSPITAL_COMMUNITY)
Admission: RE | Admit: 2024-01-11 | Discharge: 2024-01-11 | Disposition: A | Discharge status: Home / Self Care | Attending: Cardiology | Admitting: Cardiology

## 2024-01-11 ENCOUNTER — Ambulatory Visit (HOSPITAL_COMMUNITY): Admitting: Anesthesiology

## 2024-01-11 ENCOUNTER — Other Ambulatory Visit: Payer: Self-pay

## 2024-01-11 DIAGNOSIS — I4719 Other supraventricular tachycardia: Secondary | ICD-10-CM | POA: Insufficient documentation

## 2024-01-11 DIAGNOSIS — I471 Supraventricular tachycardia, unspecified: Secondary | ICD-10-CM | POA: Diagnosis not present

## 2024-01-11 DIAGNOSIS — I4819 Other persistent atrial fibrillation: Secondary | ICD-10-CM | POA: Insufficient documentation

## 2024-01-11 DIAGNOSIS — Z87891 Personal history of nicotine dependence: Secondary | ICD-10-CM | POA: Insufficient documentation

## 2024-01-11 DIAGNOSIS — I10 Essential (primary) hypertension: Secondary | ICD-10-CM | POA: Diagnosis not present

## 2024-01-11 DIAGNOSIS — I4891 Unspecified atrial fibrillation: Secondary | ICD-10-CM | POA: Diagnosis not present

## 2024-01-11 DIAGNOSIS — Z79899 Other long term (current) drug therapy: Secondary | ICD-10-CM | POA: Insufficient documentation

## 2024-01-11 DIAGNOSIS — K219 Gastro-esophageal reflux disease without esophagitis: Secondary | ICD-10-CM | POA: Diagnosis not present

## 2024-01-11 HISTORY — PX: SVT ABLATION: EP1225

## 2024-01-11 LAB — POCT ACTIVATED CLOTTING TIME
Activated Clotting Time: 245 s
Activated Clotting Time: 279 s

## 2024-01-11 SURGERY — SVT ABLATION
Anesthesia: Monitor Anesthesia Care

## 2024-01-11 MED ORDER — PROPOFOL 10 MG/ML IV BOLUS
INTRAVENOUS | Status: DC | PRN
  Administered 2024-01-11: 50 mg via INTRAVENOUS
  Administered 2024-01-11: 30 mg via INTRAVENOUS

## 2024-01-11 MED ORDER — HEPARIN (PORCINE) IN NACL 2000-0.9 UNIT/L-% IV SOLN
INTRAVENOUS | Status: DC | PRN
  Administered 2024-01-11: 1000 mL

## 2024-01-11 MED ORDER — METOPROLOL TARTRATE 5 MG/5ML IV SOLN: 5.0000 mg | Freq: Once | INTRAVENOUS | Status: DC

## 2024-01-11 MED ORDER — BUPIVACAINE HCL (PF) 0.25 % IJ SOLN
INTRAMUSCULAR | Status: DC | PRN
  Administered 2024-01-11: 30 mL

## 2024-01-11 MED ORDER — BUPIVACAINE HCL (PF) 0.25 % IJ SOLN
INTRAMUSCULAR | Status: AC
  Filled 2024-01-11: qty 60

## 2024-01-11 MED ORDER — FENTANYL CITRATE (PF) 100 MCG/2ML IJ SOLN
INTRAMUSCULAR | Status: DC | PRN
  Administered 2024-01-11: 25 ug via INTRAVENOUS
  Administered 2024-01-11: 50 ug via INTRAVENOUS
  Administered 2024-01-11: 25 ug via INTRAVENOUS
  Administered 2024-01-11 (×2): 50 ug via INTRAVENOUS

## 2024-01-11 MED ORDER — HEPARIN (PORCINE) IN NACL 1000-0.9 UT/500ML-% IV SOLN
INTRAVENOUS | Status: DC | PRN
  Administered 2024-01-11: 500 mL

## 2024-01-11 MED ORDER — SODIUM CHLORIDE 0.9 % IV SOLN: INTRAVENOUS | Status: DC

## 2024-01-11 MED ORDER — PROPOFOL 500 MG/50ML IV EMUL
INTRAVENOUS | Status: DC | PRN
  Administered 2024-01-11: 25 ug/kg/min via INTRAVENOUS

## 2024-01-11 MED ORDER — SODIUM CHLORIDE 0.9% FLUSH: 3.0000 mL | INTRAVENOUS | Status: DC | PRN

## 2024-01-11 MED ORDER — FENTANYL CITRATE (PF) 100 MCG/2ML IJ SOLN
INTRAMUSCULAR | Status: AC
  Filled 2024-01-11: qty 2

## 2024-01-11 MED ORDER — SODIUM CHLORIDE 0.9% FLUSH: 3.0000 mL | Freq: Two times a day (BID) | INTRAVENOUS | Status: DC

## 2024-01-11 MED ORDER — ONDANSETRON HCL 4 MG/2ML IJ SOLN
INTRAMUSCULAR | Status: DC | PRN
  Administered 2024-01-11: 4 mg via INTRAVENOUS

## 2024-01-11 MED ORDER — ACETAMINOPHEN 325 MG PO TABS: 650.0000 mg | ORAL_TABLET | ORAL | Status: DC | PRN

## 2024-01-11 MED ORDER — SODIUM CHLORIDE 0.9 % IV SOLN
250.0000 mL | INTRAVENOUS | Status: DC | PRN
Start: 2024-01-11 — End: 2024-01-12

## 2024-01-11 MED ORDER — HEPARIN SODIUM (PORCINE) 1000 UNIT/ML IJ SOLN
INTRAMUSCULAR | Status: AC
  Filled 2024-01-11: qty 10

## 2024-01-11 MED ORDER — METOPROLOL TARTRATE 50 MG PO TABS: 50.0000 mg | ORAL_TABLET | Freq: Once | ORAL | Status: DC

## 2024-01-11 MED ORDER — LIDOCAINE 2% (20 MG/ML) 5 ML SYRINGE
INTRAMUSCULAR | Status: DC | PRN
  Administered 2024-01-11: 40 mg via INTRAVENOUS

## 2024-01-11 MED ORDER — HEPARIN SODIUM (PORCINE) 1000 UNIT/ML IJ SOLN
INTRAMUSCULAR | Status: DC | PRN
Start: 2024-01-11 — End: 2024-01-11
  Administered 2024-01-11: 5000 [IU] via INTRAVENOUS
  Administered 2024-01-11: 8000 [IU] via INTRAVENOUS

## 2024-01-11 MED ORDER — ONDANSETRON HCL 4 MG/2ML IJ SOLN: 4.0000 mg | Freq: Four times a day (QID) | INTRAMUSCULAR | Status: DC | PRN

## 2024-01-11 MED ORDER — PROTAMINE SULFATE 10 MG/ML IV SOLN
INTRAVENOUS | Status: DC | PRN
  Administered 2024-01-11: 35 mg via INTRAVENOUS
  Administered 2024-01-11: 5 mg via INTRAVENOUS

## 2024-01-11 SURGICAL SUPPLY — 14 items
CATH ABLAT QDOT MICRO BI TC DF (CATHETERS) IMPLANT
CATH DECANAV D CURVE (CATHETERS) IMPLANT
CATH GE 8FR SOUNDSTAR (CATHETERS) IMPLANT
CLOSURE MYNX CONTROL 6F/7F (Vascular Products) IMPLANT
CLOSURE PERCLOSE PROSTYLE (Vascular Products) IMPLANT
KIT VERSACROSS 8.5F 63 45D 180 (KITS) IMPLANT
PACK EP LF (CUSTOM PROCEDURE TRAY) ×1 IMPLANT
PAD DEFIB RADIO PHYSIO CONN (PAD) ×1 IMPLANT
PATCH CARTO3 (PAD) IMPLANT
SHEATH CARTO VIZIGO SM CVD (SHEATH) IMPLANT
SHEATH PINNACLE 8F 10CM (SHEATH) IMPLANT
SHEATH PINNACLE 9F 10CM (SHEATH) IMPLANT
SHEATH PROBE COVER 6X72 (BAG) IMPLANT
TUBING SMART ABLATE COOLFLOW (TUBING) IMPLANT

## 2024-01-11 NOTE — Discharge Instructions (Signed)

## 2024-01-11 NOTE — H&P (Signed)
  Electrophysiology Office Note:   Date:  01/11/2024  ID:  Patrick Chase, Patrick Chase 1985-12-11, MRN [id number]  Primary Cardiologist: None Primary Heart Failure: None Electrophysiologist: Araya Roel Gladis Norton, MD      History of Present Illness:   Patrick Chase is a 38 y.o. male with h/o atrial fibrillation, atrial tachycardia, PACs seen today for routine electrophysiology followup.   Today, denies symptoms of palpitations, chest pain, dyspnea, orthopnea, PND, lower extremity edema, claudication, dizziness, presyncope, syncope, bleeding, or neurologic sequela. The patient is tolerating medications without difficulties. Plan ablation today.   EP Information / Studies Reviewed:    EKG is ordered today. Personal review as below.       Risk Assessment/Calculations:    CHA2DS2-VASc Score = 0   This indicates a 0.2% annual risk of stroke. The patient's score is based upon: CHF History: 0 HTN History: 0 Diabetes History: 0 Stroke History: 0 Vascular Disease History: 0 Age Score: 0 Gender Score: 0             Physical Exam:   VS:  There were no vitals taken for this visit.   Wt Readings from Last 3 Encounters:  12/21/23 95.3 kg  09/26/23 92.5 kg  07/03/23 87.1 kg    GEN: Well nourished, well developed in no acute distress NECK: No JVD; No carotid bruits CARDIAC: Regular rate and rhythm, no murmurs, rubs, gallops RESPIRATORY:  Clear to auscultation without rales, wheezing or rhonchi  ABDOMEN: Soft, non-tender, non-distended EXTREMITIES:  No edema; No deformity    ASSESSMENT AND PLAN:    1.  Persistent atrial fibrillation/atrial tachycardia: Patrick Chase has presented today for surgery, with the diagnosis of PAC, AF.  The various methods of treatment have been discussed with the patient and family. After consideration of risks, benefits and other options for treatment, the patient has consented to  Procedure(s): Catheter ablation as a surgical intervention .  Risks include but  not limited to complete heart block, stroke, esophageal damage, nerve damage, bleeding, vascular damage, tamponade, perforation, MI, and death. The patient's history has been reviewed, patient examined, no change in status, stable for surgery.  I have reviewed the patient's chart and labs.  Questions were answered to the patient's satisfaction.    Rachyl Wuebker Norton, MD 01/11/2024 9:48 AM

## 2024-01-11 NOTE — Transfer of Care (Signed)
 Immediate Anesthesia Transfer of Care Note  Patient: Patrick Chase  Procedure(s) Performed: SVT ABLATION  Patient Location: PACU and Cath Lab  Anesthesia Type:MAC  Level of Consciousness: awake, alert , and oriented  Airway & Oxygen Therapy: Patient Spontanous Breathing  Post-op Assessment: Report given to RN and Post -op Vital signs reviewed and stable  Post vital signs: Reviewed and stable  Last Vitals:  Vitals Value Taken Time  BP 118/94 01/11/24 15:02  Temp 36.8 C 01/11/24 15:04  Pulse 59 01/11/24 15:04  Resp 11 01/11/24 15:04  SpO2 97 % 01/11/24 15:04  Vitals shown include unfiled device data.  Last Pain:  Vitals:   01/11/24 1504  TempSrc: Temporal  PainSc: 0-No pain         Complications: There were no known notable events for this encounter.

## 2024-01-11 NOTE — Progress Notes (Signed)
 Patient ambulated in the hallway and to the bathroom to void. Bilateral groin dressings clean, dry, and intact. No bleeding or hematoma noted to the sites.

## 2024-01-11 NOTE — Anesthesia Preprocedure Evaluation (Addendum)
 Anesthesia Evaluation  Patient identified by MRN, date of birth, ID band Patient awake    Reviewed: Allergy & Precautions, NPO status , Patient's Chart, lab work & pertinent test results  Airway Mallampati: I  TM Distance: >3 FB Neck ROM: Full    Dental   Pulmonary former smoker   breath sounds clear to auscultation       Cardiovascular hypertension, Pt. on medications and Pt. on home beta blockers + dysrhythmias Atrial Fibrillation and Supra Ventricular Tachycardia  Rhythm:Regular Rate:Normal     Neuro/Psych negative neurological ROS     GI/Hepatic ,GERD  Medicated and Poorly Controlled,,(+)     substance abuse  alcohol use  Endo/Other    Renal/GU negative Renal ROS     Musculoskeletal   Abdominal   Peds  Hematology negative hematology ROS (+)   Anesthesia Other Findings   Reproductive/Obstetrics                              Anesthesia Physical Anesthesia Plan  ASA: 2  Anesthesia Plan: MAC   Post-op Pain Management: Minimal or no pain anticipated   Induction:   PONV Risk Score and Plan: 1 and Propofol  infusion, Ondansetron , Treatment may vary due to age or medical condition and Midazolam   Airway Management Planned: Natural Airway and Simple Face Mask  Additional Equipment: None  Intra-op Plan:   Post-operative Plan:   Informed Consent: I have reviewed the patients History and Physical, chart, labs and discussed the procedure including the risks, benefits and alternatives for the proposed anesthesia with the patient or authorized representative who has indicated his/her understanding and acceptance.       Plan Discussed with:   Anesthesia Plan Comments:          Anesthesia Quick Evaluation

## 2024-01-12 ENCOUNTER — Encounter (HOSPITAL_COMMUNITY): Payer: Self-pay | Admitting: Cardiology

## 2024-01-12 ENCOUNTER — Telehealth (HOSPITAL_COMMUNITY): Payer: Self-pay

## 2024-01-12 NOTE — Anesthesia Postprocedure Evaluation (Signed)
 Anesthesia Post Note  Patient: WALLICE GRANVILLE  Procedure(s) Performed: SVT ABLATION     Patient location during evaluation: PACU Anesthesia Type: MAC Level of consciousness: awake and alert Pain management: pain level controlled Vital Signs Assessment: post-procedure vital signs reviewed and stable Respiratory status: spontaneous breathing, nonlabored ventilation, respiratory function stable and patient connected to nasal cannula oxygen Cardiovascular status: stable and blood pressure returned to baseline Postop Assessment: no apparent nausea or vomiting Anesthetic complications: no   There were no known notable events for this encounter.  Last Vitals:  Vitals:   01/11/24 1712 01/11/24 1800  BP: (!) 148/107 (!) 137/103  Pulse: (!) 132 85  Resp: 19 10  Temp:    SpO2: 99% 96%    Last Pain:  Vitals:   01/11/24 1542  TempSrc:   PainSc: 0-No pain                 Epifanio Lamar BRAVO

## 2024-01-12 NOTE — Telephone Encounter (Signed)
 Attempted to reach patient to follow up with procedure completed on 01/11/24, no answer. Unable to leave message, VM full.

## 2024-01-15 NOTE — Telephone Encounter (Signed)
 Patient returned call to complete post procedure follow up.  Patient reports no complications with groin sites.   Instructions reviewed with patient:  It is normal to have bruising, tenderness, mild swelling, and a pea or marble sized lump/knot at the groin site which can take up to three months to resolve.  Get help right away if you notice sudden swelling at the puncture site.  Check your puncture site every day for signs of infection: fever, redness, swelling, pus drainage, warmth, foul odor or excessive pain. If this occurs, please call [phone], to speak with the RN Navigator. Get help right away if your puncture site is bleeding and the bleeding does not stop after applying firm pressure to the area.  You may continue to have skipped beats during the first several months after your procedure.   You will follow up with the APP 4 weeks after your procedure. Activity restrictions reviewed.  Patient verbalized understanding to all instructions provided.

## 2024-01-16 ENCOUNTER — Telehealth: Payer: Self-pay | Admitting: Cardiology

## 2024-01-16 NOTE — Telephone Encounter (Signed)
 I called and spoke with the patient's wife. She stated that she spoke with a nurse yesterday about a [facility] referral for her husband. This referral is coming from the recommendations of Dr. Inocencio after this patient's ablation. She stated that Dr. Inocencio wanted a second opinion on the ablation and if they should place a pacemaker instead. She was adamant on getting this done this year if possible due to insurance, but understands if that is not possible. I do not see a referral placed or any record of this phone call. I let her know that Maeola Domino, RN will be able to assist further on this.

## 2024-01-16 NOTE — Telephone Encounter (Signed)
 Pts wife calling to f/u on referral to [facility]. She states they have met their deductible for the year and would like to have this visit done before end of year if possible. Please advise.

## 2024-01-22 ENCOUNTER — Telehealth: Payer: Self-pay | Admitting: Cardiology

## 2024-01-22 NOTE — Telephone Encounter (Signed)
 Pt aware faxed paperwork.  Re-faxed with updated return to work date of 01/23/24. Pt appreciates the help with getting completed/faxed.

## 2024-01-22 NOTE — Telephone Encounter (Signed)
 Pt is calling to check status of his [employer] Paperwork.

## 2024-01-24 NOTE — Telephone Encounter (Signed)
Re faxed, confirmation received.

## 2024-01-24 NOTE — Telephone Encounter (Signed)
-  Company called and said they did not received pages 2-3 and asked if we can re-fax to [phone]

## 2024-01-25 ENCOUNTER — Other Ambulatory Visit: Payer: Self-pay | Admitting: *Deleted

## 2024-01-25 DIAGNOSIS — I471 Supraventricular tachycardia, unspecified: Secondary | ICD-10-CM

## 2024-01-25 DIAGNOSIS — I491 Atrial premature depolarization: Secondary | ICD-10-CM

## 2024-02-11 NOTE — Progress Notes (Unsigned)
 " Cardiology Office Note:  .   Date:  02/11/2024  ID:  Patrick Chase, DOB [birth year], MRN [id number] PCP: [facility], [facility]  [facility] Providers Cardiologist:  None Electrophysiologist:  Patrick Gladis Norton, MD {  History of Present Illness: .   Patrick Chase is a 38 y.o. male w/PMHx of  HTN AFib, ATach, PACs, PVCs  He saw Dr. Norton 09/26/23, no symptoms of Afib since his ablatio back in 2022, but bothered quite a bit by frequent PACs despite flecainide   Ablation 01/11/24 PACs  Subsequent phone notes for referral to [facility] Referral made for Dr Clem Repress and Dr. Alyce File  Today's visit is scheduled as his post SVT ablation visit ROS:   Generally feels poorly, very tired all of the time Admittedly, he has a fair amount of life stress, sounds like his job is stressful, remodeling the house, 2 young children, but never really feels like he has much energy.  I feel older then  a 38 year old man Work causes some anxiety, the palpitations also cause anxiety, both seem to make both worse. Much of this is attributed to his HR's arrhythmia, ectopy Doesn't feel like he is getting much if any benefit from flecainide  or the metoprolol . The last addition of diltiazem  has helped some perhaps.  Palpitations can be flutters others are racing and can last for an hour or so. Has days that he is all day on/off aware of his heart rhythm. Less days that these are quieted. On good days from a rhythm perspective, he does feel better  No near syncope or syncope With the longer duration fast rates can make him feel a little lightheaded No CP  He has been reluctant to reach out to Dr. Repress or Dr. File, doesn't think he would want to pursue more ablations/procedures.  He made a watch tracing using a friend's watch that he shows me, there is motion artifact though appears c/w AFib, 100's   Arrhythmia/AAD hx AFib ablation March 2022 flecainide  01/11/24 ablation  PAC ablation > ablation from both the non-/right coronary cusp and the left atrial septum  Noted: During ablation, the patient started to have other PACs with multiple different coupling intervals. After review of all electrograms, it appeared that earliest signals were in the right atrium adjacent to the His   Studies Reviewed: SABRA    EKG done today and reviewed by myself:  SR 73bpm, no PACs  01/11/24: EPS/ablation CONCLUSIONS:  1. Sinus rhythm upon presentation.  2.  Ablation of PVCs from both the non-/right coronary cusp and the left atrial septum 3. No early apparent complications.    09/14/2020: TTE 1. Left ventricular ejection fraction, by estimation, is 50 to 55%. The  left ventricle has low normal function. The left ventricle has no regional  wall motion abnormalities. Left ventricular diastolic parameters are  indeterminate.   2. Right ventricular systolic function is normal. The right ventricular  size is normal.   3. The mitral valve is normal in structure. Trivial mitral valve  regurgitation.   4. The aortic valve is normal in structure. Aortic valve regurgitation is  not visualized. No aortic stenosis is present.     04/30/2020: EPS/ablation CONCLUSIONS: 1. Sinus rhythm upon presentation.   2. Successful electrical isolation and anatomical encircling of all four pulmonary veins with radiofrequency current. 3. No early apparent complications.   Risk Assessment/Calculations:    Physical Exam:   VS:  There were no vitals taken for  this visit.   Wt Readings from Last 3 Encounters:  01/11/24 220 lb (99.8 kg)  12/21/23 210 lb (95.3 kg)  09/26/23 204 lb (92.5 kg)    GEN: Well nourished, well developed in no acute distress NECK: No JVD; No carotid bruits CARDIAC: RRR, + extrasystoles, no murmurs, rubs, gallops RESPIRATORY:  CTA b/l without rales, wheezing or rhonchi  ABDOMEN: Soft, non-tender, non-distended EXTREMITIES: No edema; No deformity   ASSESSMENT AND  PLAN: .    paroxysmal AFib CHA2DS2Vasc is one Ablation 2022  PACs Ablation Oct 2025 No PACs on his EKG, + on his exam though  Some of his fast rates are lasting an hour or so, these are perceived/feeling his HR fast, unclear rate, no data, no way to check his pulse, they make him feel poorly.  He would not be inclined to want to pursue more ablation/procedures I think he would be a tikosyn candidate, but he would rather avoid a medication that requires a 3.5 day admission.  He is on a pretty good dose of metoprolol , perhaps this is contributing to his marked fatigue. Patrick reduce metoprolol  to 50mg  every day Change his dilt to 180mg  daily Keep the 60mg  dilt as a PRN for sustained palpitations. I've asked him to wear yet another monitor to revisit PAC burden AFib   Dispo: back in a month, sooner if needed  Signed, Patrick Macario Arthur, PA-C   "

## 2024-02-12 ENCOUNTER — Ambulatory Visit

## 2024-02-12 ENCOUNTER — Ambulatory Visit: Attending: Physician Assistant | Admitting: Physician Assistant

## 2024-02-12 VITALS — BP 128/72 | HR 73 | Ht 69.0 in | Wt 213.0 lb

## 2024-02-12 DIAGNOSIS — R002 Palpitations: Secondary | ICD-10-CM

## 2024-02-12 DIAGNOSIS — I480 Paroxysmal atrial fibrillation: Secondary | ICD-10-CM | POA: Diagnosis not present

## 2024-02-12 DIAGNOSIS — I491 Atrial premature depolarization: Secondary | ICD-10-CM | POA: Diagnosis not present

## 2024-02-12 MED ORDER — DILTIAZEM HCL ER COATED BEADS 180 MG PO CP24: 180.0000 mg | ORAL_CAPSULE | Freq: Every day | ORAL | 3 refills | Status: AC

## 2024-02-12 NOTE — Progress Notes (Unsigned)
 Enrolled for [employer] to mail a ZIO XT long term holter monitor to the patients address on file.   Dr. Elberta Fortis to read.

## 2024-02-12 NOTE — Patient Instructions (Addendum)
 Medication Instructions:   START  TAKING : TOPROL   XL 50 MG ONCE  A  DAY    START  TAKING :  DILTIAZEM  60 MG AS NEEDED FOR SUSTAINED  PALPITATIONS   START  TAKING :   DILTIAZEM  180 MG   ONCE  A  DAY    *If you need a refill on your cardiac medications before your next appointment, please call your pharmacy*   Lab Work:NONE ORDERED  TODAY    If you have labs (blood work) drawn today and your tests are completely normal, you will receive your results only by: MyChart Message (if you have MyChart) OR A paper copy in the mail If you have any lab test that is abnormal or we need to change your treatment, we will call you to review the results.    Testing/Procedures:  Your physician has recommended that you wear an event monitor. Event monitors are medical devices that record the hearts electrical activity. Doctors most often us  these monitors to diagnose arrhythmias. Arrhythmias are problems with the speed or rhythm of the heartbeat. The monitor is a small, portable device. You can wear one while you do your normal daily activities. This is usually used to diagnose what is causing palpitations/syncope (passing out).    Follow-Up: At [facility], you and your health needs are our priority.  As part of our continuing mission to provide you with exceptional heart care, our providers are all part of one team.  This team includes your primary Cardiologist (physician) and Advanced Practice Providers or APPs (Physician Assistants and Nurse Practitioners) who all work together to provide you with the care you need, when you need it.  Your next appointment:  1 month(s)   Provider:   Soyla Norton, MD or Charlies Arthur, PA-C  ( CONTACT  CASSIE HALL/ ANGELINE HAMMER FOR EP SCHEDULING ISSUES )   We recommend signing up for the patient portal called MyChart.  Sign up information is provided on this After Visit Summary.  MyChart is used to connect with patients for Virtual Visits  (Telemedicine).  Patients are able to view lab/test results, encounter notes, upcoming appointments, etc.  Non-urgent messages can be sent to your provider as well.   To learn more about what you can do with MyChart, go to [email or web address].   Other Instructions   ZIO XT- Long Term Monitor Instructions  Your physician has requested you wear a ZIO patch monitor for 7  days.  This is a single patch monitor. [employer] supplies one patch monitor per enrollment. Additional stickers are not available. Please do not apply patch if you will be having a Nuclear Stress Test,  Echocardiogram, Cardiac CT, MRI, or Chest Xray during the period you would be wearing the  monitor. The patch cannot be worn during these tests. You cannot remove and re-apply the  ZIO XT patch monitor.  Your ZIO patch monitor will be mailed 3 day USPS to your address on file. It may take 3-5 days  to receive your monitor after you have been enrolled.  Once you have received your monitor, please review the enclosed instructions. Your monitor  has already been registered assigning a specific monitor serial # to you.  Billing and Patient Assistance Program Information  We have supplied [employer] with any of your insurance information on file for billing purposes. [employer] offers a sliding scale Patient Assistance Program for patients that do not have  insurance, or whose insurance does not completely cover the cost  of the ZIO monitor.  You must apply for the Patient Assistance Program to qualify for this discounted rate.  To apply, please call [employer] at [phone], select option 4, select option 2, ask to apply for  Patient Assistance Program. Meredeth will ask your household income, and how many people  are in your household. They will quote your out-of-pocket cost based on that information.  [employer] will also be able to set up a 71-month, interest-free payment plan if needed.  Applying the monitor   Shave hair from  upper left chest.  Hold abrader disc by orange tab. Rub abrader in 40 strokes over the upper left chest as  indicated in your monitor instructions.  Clean area with 4 enclosed alcohol pads. Let dry.  Apply patch as indicated in monitor instructions. Patch will be placed under collarbone on left  side of chest with arrow pointing upward.  Rub patch adhesive wings for 2 minutes. Remove white label marked 1. Remove the white  label marked 2. Rub patch adhesive wings for 2 additional minutes.  While looking in a mirror, press and release button in center of patch. A small green light will  flash 3-4 times. This will be your only indicator that the monitor has been turned on.  Do not shower for the first 24 hours. You may shower after the first 24 hours.  Press the button if you feel a symptom. You will hear a small click. Record Date, Time and  Symptom in the Patient Logbook.  When you are ready to remove the patch, follow instructions on the last 2 pages of Patient  Logbook. Stick patch monitor onto the last page of Patient Logbook.  Place Patient Logbook in the blue and white box. Use locking tab on box and tape box closed  securely. The blue and white box has prepaid postage on it. Please place it in the mailbox as  soon as possible. Your physician should have your test results approximately 7 days after the  monitor has been mailed back to [facility].  Call [facility] Customer Care at [phone] if you have questions regarding  your ZIO XT patch monitor. Call them immediately if you see an orange light blinking on your  monitor.  If your monitor falls off in less than 4 days, contact our Monitor department at [phone].  If your monitor becomes loose or falls off after 4 days call [employer] at [phone] for  suggestions on securing your monitor

## 2024-03-04 ENCOUNTER — Ambulatory Visit: Payer: Self-pay | Admitting: Physician Assistant

## 2024-03-04 DIAGNOSIS — R002 Palpitations: Secondary | ICD-10-CM | POA: Diagnosis not present

## 2024-04-04 ENCOUNTER — Ambulatory Visit: Admitting: Physician Assistant
